# Patient Record
Sex: Female | Born: 1995 | Race: White | Hispanic: No | Marital: Married | Smoking: Never smoker
Health system: Southern US, Community
[De-identification: ages and names within clinical notes are randomized; demographics above are authoritative.]

## PROBLEM LIST (undated history)

## (undated) DIAGNOSIS — N912 Amenorrhea, unspecified: Secondary | ICD-10-CM

## (undated) DIAGNOSIS — G4730 Sleep apnea, unspecified: Secondary | ICD-10-CM

## (undated) DIAGNOSIS — F32A Depression, unspecified: Secondary | ICD-10-CM

## (undated) DIAGNOSIS — F419 Anxiety disorder, unspecified: Secondary | ICD-10-CM

## (undated) DIAGNOSIS — Z8742 Personal history of other diseases of the female genital tract: Secondary | ICD-10-CM

## (undated) DIAGNOSIS — K810 Acute cholecystitis: Secondary | ICD-10-CM

## (undated) HISTORY — PX: WISDOM TOOTH EXTRACTION: SHX21

## (undated) HISTORY — DX: Sleep apnea, unspecified: G47.30

## (undated) HISTORY — DX: Acute cholecystitis: K81.0

## (undated) HISTORY — PX: MANDIBLE FRACTURE SURGERY: SHX706

## (undated) HISTORY — DX: Anxiety disorder, unspecified: F41.9

## (undated) HISTORY — PX: TONSILLECTOMY: SUR1361

## (undated) HISTORY — DX: Amenorrhea, unspecified: N91.2

## (undated) HISTORY — DX: Depression, unspecified: F32.A

## (undated) HISTORY — DX: Personal history of other diseases of the female genital tract: Z87.42

## (undated) HISTORY — PX: GALLBLADDER SURGERY: SHX652

---
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
## 2015-12-07 ENCOUNTER — Encounter: Payer: Self-pay | Admitting: Obstetrics and Gynecology

## 2015-12-07 ENCOUNTER — Ambulatory Visit (INDEPENDENT_AMBULATORY_CARE_PROVIDER_SITE_OTHER): Payer: Managed Care, Other (non HMO) | Admitting: Obstetrics and Gynecology

## 2015-12-07 VITALS — BP 131/80 | HR 94 | Ht 67.0 in | Wt 188.9 lb

## 2015-12-07 DIAGNOSIS — Z8742 Personal history of other diseases of the female genital tract: Secondary | ICD-10-CM

## 2015-12-07 HISTORY — DX: Personal history of other diseases of the female genital tract: Z87.42

## 2015-12-07 NOTE — Progress Notes (Signed)
    GYNECOLOGY PROGRESS NOTE  Subjective:    Patient ID: Kari Conner, female    DOB: 06/04/1995, 20 y.o.   MRN: [id number]  HPI  Patient is a 20 y.o. G0P0000 female who presents for follow up of ruptured ovarian cyst.  [facility]2015 due to sudden onset of abdominal pain.  Was diagnosed with suspected ruptured right ovarian cyst based on ultrasound and CT findings.  Patient denies h/o ovarian cysts.  Currently asymptomatic today.    History reviewed. No pertinent past medical history.  Family History  Problem Relation Age of Onset  . Hyperlipidemia Mother   . Hyperlipidemia Father   . Cancer Maternal Grandmother     Past Surgical History:  Procedure Laterality Date  . TONSILLECTOMY    . WISDOM TOOTH EXTRACTION      Social History   Social History  . Marital status: Single    Spouse name: N/A  . Number of children: N/A  . Years of education: N/A   Occupational History  . Not on file.   Social History Main Topics  . Smoking status: Never Smoker  . Smokeless tobacco: Former NeurosurgeonUser  . Alcohol use Yes     Comment: social  . Drug use: No  . Sexual activity: Yes    Birth control/ protection: None   Other Topics Concern  . Not on file   Social History Narrative  . No narrative on file    No current outpatient prescriptions on file prior to visit.   No current facility-administered medications on file prior to visit.     No Known Allergies   Review of Systems A comprehensive review of systems was negative.   Objective:   Blood pressure 131/80, pulse 94, height 5\' 7"  (1.702 m), weight 188 lb 14.4 oz (85.7 kg), last menstrual period 11/23/2015. General appearance: alert and no distress Abdomen: soft, non-tender; bowel sounds normal; no masses,  no organomegaly Pelvic: deferred Extremities: extremities normal, atraumatic, no cyanosis or edema Neurologic: Grossly normal   Assessment:   H/o ruptured ovarian  cyst  Plan:   - Reviewed ER notes, labs, and imaging. Discussion had on diagnosis and potential management options.  Discussed that patient may be at risk for development of cysts in the future.  Recommended hormonal suppression with OCPs for contraception as well as for prevention of future cysts. Patient notes that she had her boyfriend have been discussion the possibility of conceiving soon.  Has been off OCPs (Lo-Loestrin) since January as she was non-compliant (forgetting pills) and decided that she did not want hormonal manipulation of her cycles. Declines resumption of OCPs at this time. Advised that there is no further f/u needed at this time.   - Patient desires to have annual exam as she notes it has been over 1 year since her last one.  Will schedule to return in 1 month.     A total of 30 minutes were spent face-to-face with the patient during this encounter and over half of that time involved counseling and review of records.  Hildred LaserAnika Mirca Yale, MD [facility]

## 2016-01-26 ENCOUNTER — Encounter: Payer: Self-pay | Admitting: Obstetrics and Gynecology

## 2016-01-26 ENCOUNTER — Ambulatory Visit (INDEPENDENT_AMBULATORY_CARE_PROVIDER_SITE_OTHER): Payer: Managed Care, Other (non HMO) | Admitting: Obstetrics and Gynecology

## 2016-01-26 DIAGNOSIS — Z124 Encounter for screening for malignant neoplasm of cervix: Secondary | ICD-10-CM

## 2016-01-26 DIAGNOSIS — L680 Hirsutism: Secondary | ICD-10-CM | POA: Diagnosis not present

## 2016-01-26 DIAGNOSIS — Z01419 Encounter for gynecological examination (general) (routine) without abnormal findings: Secondary | ICD-10-CM

## 2016-01-26 LAB — HM PAP SMEAR: HM Pap smear: NEGATIVE

## 2016-01-26 NOTE — Progress Notes (Signed)
GYNECOLOGY ANNUAL PHYSICAL EXAM PROGRESS NOTE  Subjective:    Kari Conner is a 20 y.o. G0P0000 female who presents for an annual exam.  The patient is sexually active (same partner x 4 years). The patient wears seatbelts: yes. The patient participates in regular exercise: no. Has the patient ever been transfused or tattooed?: no. The patient reports that there is not domestic violence in her life.    The patient has the following complaints today: 1. Notes increased hair growth around nipples.  Has been ongoing for the past 3-4 weeks. Has always had slight facial hair (scarce mustache)  Gynecologic History Menarche age: [age] Patient's last menstrual period was 01/23/2016 (exact date). Contraception: none History of STI's: Denies  Last Pap: No prior pap history   Obstetric History   G0   P0   T0   P0   A0   L0    SAB0   TAB0   Ectopic0   Multiple0   Live Births0       History reviewed. No pertinent past medical history.  Past Surgical History:  Procedure Laterality Date  . TONSILLECTOMY    . WISDOM TOOTH EXTRACTION      Family History  Problem Relation Age of Onset  . Hyperlipidemia Mother   . Hyperlipidemia Father   . Cancer (Lung) Maternal Grandmother     Social History   Social History  . Marital status: Single    Spouse name: N/A  . Number of children: N/A  . Years of education: N/A   Occupational History  . Not on file.   Social History Main Topics  . Smoking status: Never Smoker  . Smokeless tobacco: Former NeurosurgeonUser  . Alcohol use Yes     Comment: social  . Drug use: No  . Sexual activity: Yes    Birth control/ protection: None   Other Topics Concern  . Not on file   Social History Narrative  . No narrative on file    No current outpatient prescriptions on file prior to visit.   No current facility-administered medications on file prior to visit.     No Known Allergies   Review of Systems Constitutional: negative for chills,  fatigue, fevers and sweats Eyes: negative for irritation, redness and visual disturbance Ears, nose, mouth, throat, and face: negative for hearing loss, nasal congestion, snoring and tinnitus Respiratory: negative for asthma, cough, sputum Cardiovascular: negative for chest pain, dyspnea, exertional chest pressure/discomfort, irregular heart beat, palpitations and syncope Gastrointestinal: negative for abdominal pain, change in bowel habits, nausea and vomiting Genitourinary: negative for abnormal menstrual periods, genital lesions, sexual problems and vaginal discharge, dysuria and urinary incontinence Integument/breast: negative for breast lump, breast tenderness and nipple discharge Hematologic/lymphatic: negative for bleeding and easy bruising Musculoskeletal:negative for back pain and muscle weakness Neurological: negative for dizziness, headaches, vertigo and weakness Endocrine: negative for diabetic symptoms including polydipsia, polyuria and skin dryness Allergic/Immunologic: negative for hay fever and urticaria      Objective:  Blood pressure 114/76, pulse 83, height 5\' 7"  (1.702 m), weight 188 lb 14.4 oz (85.7 kg), last menstrual period 01/23/2016. Body mass index is 29.59 kg/m.   General Appearance:    Alert, cooperative, no distress, appears stated age, overweight   Head:    Normocephalic, without obvious abnormality, atraumatic  Eyes:    PERRL, conjunctiva/corneas clear, EOM's intact, both eyes  Ears:    Normal external ear canals, both ears  Nose:   Nares normal, septum midline, mucosa  normal, no drainage or sinus tenderness  Throat:   Lips, mucosa, and tongue normal; teeth and gums normal  Neck:   Supple, symmetrical, trachea midline, no adenopathy; thyroid: no enlargement/tenderness/nodules; no carotid bruit or JVD  Back:     Symmetric, no curvature, ROM normal, no CVA tenderness  Lungs:     Clear to auscultation bilaterally, respirations unlabored  Chest Wall:    No  tenderness or deformity   Heart:    Regular rate and rhythm, S1 and S2 normal, no murmur, rub or gallop  Breast Exam:    No tenderness, masses, inverted nipples bilaterally.  Sparse long dark hair around left nipple, slightly more on right nipple.   Abdomen:     Soft, non-tender, bowel sounds active all four quadrants, no masses, no organomegaly.    Genitalia:    Pelvic:external genitalia normal, vagina without lesions, discharge, or tenderness, rectovaginal septum  normal. Cervix normal in appearance, no cervical motion tenderness, no adnexal masses or tenderness.  Uterus normal size, shape, mobile, regular contours, nontender.  Rectal:    Normal external sphincter.  No hemorrhoids appreciated. Internal exam not done.   Extremities:   Extremities normal, atraumatic, no cyanosis or edema  Pulses:   2+ and symmetric all extremities  Skin:   Skin color, texture, turgor normal, no rashes or lesions.  Scant thin hair along upper lip.    Lymph nodes:   Cervical, supraclavicular, and axillary nodes normal  Neurologic:   CNII-XII intact, normal strength, sensation and reflexes throughout   .  Labs:  No results found for: WBC, HGB, HCT, MCV, PLT  No results found for: CREATININE, BUN, NA, K, CL, CO2   Assessment:    Healthy female exam.   Cervical cancer screening  Mild hirsutism Plan:    Blood tests: Basic metabolic panel, CBC with diff and Testosterone and DHEAS levels. Breast self exam technique reviewed and patient encouraged to perform self-exam monthly. Contraception: none.  Is considering conceiving soon.  Discussed healthy lifestyle modifications. Pap smear performed today.   Discussed management options for mild hirsutism, including hair removal creams, plucking, waxing.  If hirsutism worsens, can discuss medications.  Declines flu vaccine.  Discussed Gardasil vaccine, declines.    Hildred Laser, MD [facility]

## 2016-01-26 NOTE — Patient Instructions (Signed)
Preventive Care for Adults, Female A healthy lifestyle and preventive care can promote health and wellness. Preventive health guidelines for women include the following key practices.  A routine yearly physical is a good way to check with your health care provider about your health and preventive screening. It is a chance to share any concerns and updates on your health and to receive a thorough exam.  Visit your dentist for a routine exam and preventive care every 6 months. Brush your teeth twice a day and floss once a day. Good oral hygiene prevents tooth decay and gum disease.  The frequency of eye exams is based on your age, health, family medical history, use of contact lenses, and other factors. Follow your health care provider's recommendations for frequency of eye exams.  Eat a healthy diet. Foods like vegetables, fruits, whole grains, low-fat dairy products, and lean protein foods contain the nutrients you need without too many calories. Decrease your intake of foods high in solid fats, added sugars, and salt. Eat the right amount of calories for you.Get information about a proper diet from your health care provider, if necessary.  Regular physical exercise is one of the most important things you can do for your health. Most adults should get at least 150 minutes of moderate-intensity exercise (any activity that increases your heart rate and causes you to sweat) each week. In addition, most adults need muscle-strengthening exercises on 2 or more days a week.  Maintain a healthy weight. The body mass index (BMI) is a screening tool to identify possible weight problems. It provides an estimate of body fat based on height and weight. Your health care provider can find your BMI and can help you achieve or maintain a healthy weight.For adults 20 years and older:  A BMI below 18.5 is considered underweight.  A BMI of 18.5 to 24.9 is normal.  A BMI of 25 to 29.9 is considered  overweight.  A BMI of 30 and above is considered obese.  Maintain normal blood lipids and cholesterol levels by exercising and minimizing your intake of saturated fat. Eat a balanced diet with plenty of fruit and vegetables. Blood tests for lipids and cholesterol should begin at age 64 and be repeated every 5 years. If your lipid or cholesterol levels are high, you are over 50, or you are at high risk for heart disease, you may need your cholesterol levels checked more frequently.Ongoing high lipid and cholesterol levels should be treated with medicines if diet and exercise are not working.  If you smoke, find out from your health care provider how to quit. If you do not use tobacco, do not start.  Lung cancer screening is recommended for adults aged 52-80 years who are at high risk for developing lung cancer because of a history of smoking. A yearly low-dose CT scan of the lungs is recommended for people who have at least a 30-pack-year history of smoking and are a current smoker or have quit within the past 15 years. A pack year of smoking is smoking an average of 1 pack of cigarettes a day for 1 year (for example: 1 pack a day for 30 years or 2 packs a day for 15 years). Yearly screening should continue until the smoker has stopped smoking for at least 15 years. Yearly screening should be stopped for people who develop a health problem that would prevent them from having lung cancer treatment.  If you are pregnant, do not drink alcohol. If you are  breastfeeding, be very cautious about drinking alcohol. If you are not pregnant and choose to drink alcohol, do not have more than 1 drink per day. One drink is considered to be 12 ounces (355 mL) of beer, 5 ounces (148 mL) of wine, or 1.5 ounces (44 mL) of liquor.  Avoid use of street drugs. Do not share needles with anyone. Ask for help if you need support or instructions about stopping the use of drugs.  High blood pressure causes heart disease and  increases the risk of stroke. Your blood pressure should be checked at least every 1 to 2 years. Ongoing high blood pressure should be treated with medicines if weight loss and exercise do not work.  If you are 25-78 years old, ask your health care provider if you should take aspirin to prevent strokes.  Diabetes screening is done by taking a blood sample to check your blood glucose level after you have not eaten for a certain period of time (fasting). If you are not overweight and you do not have risk factors for diabetes, you should be screened once every 3 years starting at age 86. If you are overweight or obese and you are 3-87 years of age, you should be screened for diabetes every year as part of your cardiovascular risk assessment.  Breast cancer screening is essential preventive care for women. You should practice "breast self-awareness." This means understanding the normal appearance and feel of your breasts and may include breast self-examination. Any changes detected, no matter how small, should be reported to a health care provider. Women in their 66s and 30s should have a clinical breast exam (CBE) by a health care provider as part of a regular health exam every 1 to 3 years. After age 43, women should have a CBE every year. Starting at age 37, women should consider having a mammogram (breast X-ray test) every year. Women who have a family history of breast cancer should talk to their health care provider about genetic screening. Women at a high risk of breast cancer should talk to their health care providers about having an MRI and a mammogram every year.  Breast cancer gene (BRCA)-related cancer risk assessment is recommended for women who have family members with BRCA-related cancers. BRCA-related cancers include breast, ovarian, tubal, and peritoneal cancers. Having family members with these cancers may be associated with an increased risk for harmful changes (mutations) in the breast  cancer genes BRCA1 and BRCA2. Results of the assessment will determine the need for genetic counseling and BRCA1 and BRCA2 testing.  Your health care provider may recommend that you be screened regularly for cancer of the pelvic organs (ovaries, uterus, and vagina). This screening involves a pelvic examination, including checking for microscopic changes to the surface of your cervix (Pap test). You may be encouraged to have this screening done every 3 years, beginning at age 78.  For women ages 79-65, health care providers may recommend pelvic exams and Pap testing every 3 years, or they may recommend the Pap and pelvic exam, combined with testing for human papilloma virus (HPV), every 5 years. Some types of HPV increase your risk of cervical cancer. Testing for HPV may also be done on women of any age with unclear Pap test results.  Other health care providers may not recommend any screening for nonpregnant women who are considered low risk for pelvic cancer and who do not have symptoms. Ask your health care provider if a screening pelvic exam is right for  you.  If you have had past treatment for cervical cancer or a condition that could lead to cancer, you need Pap tests and screening for cancer for at least 20 years after your treatment. If Pap tests have been discontinued, your risk factors (such as having a new sexual partner) need to be reassessed to determine if screening should resume. Some women have medical problems that increase the chance of getting cervical cancer. In these cases, your health care provider may recommend more frequent screening and Pap tests.  Colorectal cancer can be detected and often prevented. Most routine colorectal cancer screening begins at the age of 50 years and continues through age 75 years. However, your health care provider may recommend screening at an earlier age if you have risk factors for colon cancer. On a yearly basis, your health care provider may provide  home test kits to check for hidden blood in the stool. Use of a small camera at the end of a tube, to directly examine the colon (sigmoidoscopy or colonoscopy), can detect the earliest forms of colorectal cancer. Talk to your health care provider about this at age 50, when routine screening begins. Direct exam of the colon should be repeated every 5-10 years through age 75 years, unless early forms of precancerous polyps or small growths are found.  People who are at an increased risk for hepatitis B should be screened for this virus. You are considered at high risk for hepatitis B if:  You were born in a country where hepatitis B occurs often. Talk with your health care provider about which countries are considered high risk.  Your parents were born in a high-risk country and you have not received a shot to protect against hepatitis B (hepatitis B vaccine).  You have HIV or AIDS.  You use needles to inject street drugs.  You live with, or have sex with, someone who has hepatitis B.  You get hemodialysis treatment.  You take certain medicines for conditions like cancer, organ transplantation, and autoimmune conditions.  Hepatitis C blood testing is recommended for all people born from 1945 through 1965 and any individual with known risks for hepatitis C.  Practice safe sex. Use condoms and avoid high-risk sexual practices to reduce the spread of sexually transmitted infections (STIs). STIs include gonorrhea, chlamydia, syphilis, trichomonas, herpes, HPV, and human immunodeficiency virus (HIV). Herpes, HIV, and HPV are viral illnesses that have no cure. They can result in disability, cancer, and death.  You should be screened for sexually transmitted illnesses (STIs) including gonorrhea and chlamydia if:  You are sexually active and are younger than 24 years.  You are older than 24 years and your health care provider tells you that you are at risk for this type of infection.  Your sexual  activity has changed since you were last screened and you are at an increased risk for chlamydia or gonorrhea. Ask your health care provider if you are at risk.  If you are at risk of being infected with HIV, it is recommended that you take a prescription medicine daily to prevent HIV infection. This is called preexposure prophylaxis (PrEP). You are considered at risk if:  You are sexually active and do not regularly use condoms or know the HIV status of your partner(s).  You take drugs by injection.  You are sexually active with a partner who has HIV.  Talk with your health care provider about whether you are at high risk of being infected with HIV. If   you choose to begin PrEP, you should first be tested for HIV. You should then be tested every 3 months for as long as you are taking PrEP.  Osteoporosis is a disease in which the bones lose minerals and strength with aging. This can result in serious bone fractures or breaks. The risk of osteoporosis can be identified using a bone density scan. Women ages 1 years and over and women at risk for fractures or osteoporosis should discuss screening with their health care providers. Ask your health care provider whether you should take a calcium supplement or vitamin D to reduce the rate of osteoporosis.  Menopause can be associated with physical symptoms and risks. Hormone replacement therapy is available to decrease symptoms and risks. You should talk to your health care provider about whether hormone replacement therapy is right for you.  Use sunscreen. Apply sunscreen liberally and repeatedly throughout the day. You should seek shade when your shadow is shorter than you. Protect yourself by wearing long sleeves, pants, a wide-brimmed hat, and sunglasses year round, whenever you are outdoors.  Once a month, do a whole body skin exam, using a mirror to look at the skin on your back. Tell your health care provider of new moles, moles that have irregular  borders, moles that are larger than a pencil eraser, or moles that have changed in shape or color.  Stay current with required vaccines (immunizations).  Influenza vaccine. All adults should be immunized every year.  Tetanus, diphtheria, and acellular pertussis (Td, Tdap) vaccine. Pregnant women should receive 1 dose of Tdap vaccine during each pregnancy. The dose should be obtained regardless of the length of time since the last dose. Immunization is preferred during the 27th-36th week of gestation. An adult who has not previously received Tdap or who does not know her vaccine status should receive 1 dose of Tdap. This initial dose should be followed by tetanus and diphtheria toxoids (Td) booster doses every 10 years. Adults with an unknown or incomplete history of completing a 3-dose immunization series with Td-containing vaccines should begin or complete a primary immunization series including a Tdap dose. Adults should receive a Td booster every 10 years.  Varicella vaccine. An adult without evidence of immunity to varicella should receive 2 doses or a second dose if she has previously received 1 dose. Pregnant females who do not have evidence of immunity should receive the first dose after pregnancy. This first dose should be obtained before leaving the health care facility. The second dose should be obtained 4-8 weeks after the first dose.  Human papillomavirus (HPV) vaccine. Females aged 13-26 years who have not received the vaccine previously should obtain the 3-dose series. The vaccine is not recommended for use in pregnant females. However, pregnancy testing is not needed before receiving a dose. If a female is found to be pregnant after receiving a dose, no treatment is needed. In that case, the remaining doses should be delayed until after the pregnancy. Immunization is recommended for any person with an immunocompromised condition through the age of 24 years if she did not get any or all doses  earlier. During the 3-dose series, the second dose should be obtained 4-8 weeks after the first dose. The third dose should be obtained 24 weeks after the first dose and 16 weeks after the second dose.  Zoster vaccine. One dose is recommended for adults aged 97 years or older unless certain conditions are present.  Measles, mumps, and rubella (MMR) vaccine. Adults born  before 1957 generally are considered immune to measles and mumps. Adults born in [birth year] or later should have 1 or more doses of MMR vaccine unless there is a contraindication to the vaccine or there is laboratory evidence of immunity to each of the three diseases. A routine second dose of MMR vaccine should be obtained at least 28 days after the first dose for students attending postsecondary schools, health care workers, or international travelers. People who received inactivated measles vaccine or an unknown type of measles vaccine during 1963-1967 should receive 2 doses of MMR vaccine. People who received inactivated mumps vaccine or an unknown type of mumps vaccine before 1979 and are at high risk for mumps infection should consider immunization with 2 doses of MMR vaccine. For females of childbearing age, rubella immunity should be determined. If there is no evidence of immunity, females who are not pregnant should be vaccinated. If there is no evidence of immunity, females who are pregnant should delay immunization until after pregnancy. Unvaccinated health care workers born before 60 who lack laboratory evidence of measles, mumps, or rubella immunity or laboratory confirmation of disease should consider measles and mumps immunization with 2 doses of MMR vaccine or rubella immunization with 1 dose of MMR vaccine.  Pneumococcal 13-valent conjugate (PCV13) vaccine. When indicated, a person who is uncertain of his immunization history and has no record of immunization should receive the PCV13 vaccine. All adults 61 years of age and older  should receive this vaccine. An adult aged 92 years or older who has certain medical conditions and has not been previously immunized should receive 1 dose of PCV13 vaccine. This PCV13 should be followed with a dose of pneumococcal polysaccharide (PPSV23) vaccine. Adults who are at high risk for pneumococcal disease should obtain the PPSV23 vaccine at least 8 weeks after the dose of PCV13 vaccine. Adults older than 21 years of age who have normal immune system function should obtain the PPSV23 vaccine dose at least 1 year after the dose of PCV13 vaccine.  Pneumococcal polysaccharide (PPSV23) vaccine. When PCV13 is also indicated, PCV13 should be obtained first. All adults aged 2 years and older should be immunized. An adult younger than age 30 years who has certain medical conditions should be immunized. Any person who resides in a nursing home or long-term care facility should be immunized. An adult smoker should be immunized. People with an immunocompromised condition and certain other conditions should receive both PCV13 and PPSV23 vaccines. People with human immunodeficiency virus (HIV) infection should be immunized as soon as possible after diagnosis. Immunization during chemotherapy or radiation therapy should be avoided. Routine use of PPSV23 vaccine is not recommended for American Indians, [place] Natives, or people younger than 65 years unless there are medical conditions that require PPSV23 vaccine. When indicated, people who have unknown immunization and have no record of immunization should receive PPSV23 vaccine. One-time revaccination 5 years after the first dose of PPSV23 is recommended for people aged 19-64 years who have chronic kidney failure, nephrotic syndrome, asplenia, or immunocompromised conditions. People who received 1-2 doses of PPSV23 before age 44 years should receive another dose of PPSV23 vaccine at age 83 years or later if at least 5 years have passed since the previous dose. Doses  of PPSV23 are not needed for people immunized with PPSV23 at or after age 20 years.  Meningococcal vaccine. Adults with asplenia or persistent complement component deficiencies should receive 2 doses of quadrivalent meningococcal conjugate (MenACWY-D) vaccine. The doses should be obtained  at least 2 months apart. Microbiologists working with certain meningococcal bacteria, [place] recruits, people at risk during an outbreak, and people who travel to or live in countries with a high rate of meningitis should be immunized. A first-year college student up through age 28 years who is living in a residence hall should receive a dose if she did not receive a dose on or after her 16th birthday. Adults who have certain high-risk conditions should receive one or more doses of vaccine.  Hepatitis A vaccine. Adults who wish to be protected from this disease, have certain high-risk conditions, work with hepatitis A-infected animals, work in hepatitis A research labs, or travel to or work in countries with a high rate of hepatitis A should be immunized. Adults who were previously unvaccinated and who anticipate close contact with an international adoptee during the first 60 days after arrival in the [place] from a country with a high rate of hepatitis A should be immunized.  Hepatitis B vaccine. Adults who wish to be protected from this disease, have certain high-risk conditions, may be exposed to blood or other infectious body fluids, are household contacts or sex partners of hepatitis B positive people, are clients or workers in certain care facilities, or travel to or work in countries with a high rate of hepatitis B should be immunized.  Haemophilus influenzae type b (Hib) vaccine. A previously unvaccinated person with asplenia or sickle cell disease or having a scheduled splenectomy should receive 1 dose of Hib vaccine. Regardless of previous immunization, a recipient of a hematopoietic stem cell transplant  should receive a 3-dose series 6-12 months after her successful transplant. Hib vaccine is not recommended for adults with HIV infection. Preventive Services / Frequency Ages 71 to 87 years  Blood pressure check.** / Every 3-5 years.  Lipid and cholesterol check.** / Every 5 years beginning at age 1.  Clinical breast exam.** / Every 3 years for women in their 3s and 31s.  BRCA-related cancer risk assessment.** / For women who have family members with a BRCA-related cancer (breast, ovarian, tubal, or peritoneal cancers).  Pap test.** / Every 2 years from ages 50 through 86. Every 3 years starting at age 87 through age 7 or 75 with a history of 3 consecutive normal Pap tests.  HPV screening.** / Every 3 years from ages 59 through ages 35 to 6 with a history of 3 consecutive normal Pap tests.  Hepatitis C blood test.** / For any individual with known risks for hepatitis C.  Skin self-exam. / Monthly.  Influenza vaccine. / Every year.  Tetanus, diphtheria, and acellular pertussis (Tdap, Td) vaccine.** / Consult your health care provider. Pregnant women should receive 1 dose of Tdap vaccine during each pregnancy. 1 dose of Td every 10 years.  Varicella vaccine.** / Consult your health care provider. Pregnant females who do not have evidence of immunity should receive the first dose after pregnancy.  HPV vaccine. / 3 doses over 6 months, if 72 and younger. The vaccine is not recommended for use in pregnant females. However, pregnancy testing is not needed before receiving a dose.  Measles, mumps, rubella (MMR) vaccine.** / You need at least 1 dose of MMR if you were born in 1957 or later. You may also need a 2nd dose. For females of childbearing age, rubella immunity should be determined. If there is no evidence of immunity, females who are not pregnant should be vaccinated. If there is no evidence of immunity, females who are  pregnant should delay immunization until after  pregnancy.  Pneumococcal 13-valent conjugate (PCV13) vaccine.** / Consult your health care provider.  Pneumococcal polysaccharide (PPSV23) vaccine.** / 1 to 2 doses if you smoke cigarettes or if you have certain conditions.  Meningococcal vaccine.** / 1 dose if you are age 87 to 44 years and a Market researcher living in a residence hall, or have one of several medical conditions, you need to get vaccinated against meningococcal disease. You may also need additional booster doses.  Hepatitis A vaccine.** / Consult your health care provider.  Hepatitis B vaccine.** / Consult your health care provider.  Haemophilus influenzae type b (Hib) vaccine.** / Consult your health care provider. Ages 86 to 38 years  Blood pressure check.** / Every year.  Lipid and cholesterol check.** / Every 5 years beginning at age 49 years.  Lung cancer screening. / Every year if you are aged 71-80 years and have a 30-pack-year history of smoking and currently smoke or have quit within the past 15 years. Yearly screening is stopped once you have quit smoking for at least 15 years or develop a health problem that would prevent you from having lung cancer treatment.  Clinical breast exam.** / Every year after age 51 years.  BRCA-related cancer risk assessment.** / For women who have family members with a BRCA-related cancer (breast, ovarian, tubal, or peritoneal cancers).  Mammogram.** / Every year beginning at age 18 years and continuing for as long as you are in good health. Consult with your health care provider.  Pap test.** / Every 3 years starting at age 63 years through age 37 or 57 years with a history of 3 consecutive normal Pap tests.  HPV screening.** / Every 3 years from ages 41 years through ages 76 to 23 years with a history of 3 consecutive normal Pap tests.  Fecal occult blood test (FOBT) of stool. / Every year beginning at age 36 years and continuing until age 51 years. You may not need  to do this test if you get a colonoscopy every 10 years.  Flexible sigmoidoscopy or colonoscopy.** / Every 5 years for a flexible sigmoidoscopy or every 10 years for a colonoscopy beginning at age 36 years and continuing until age 35 years.  Hepatitis C blood test.** / For all people born from [birth year] through 1965 and any individual with known risks for hepatitis C.  Skin self-exam. / Monthly.  Influenza vaccine. / Every year.  Tetanus, diphtheria, and acellular pertussis (Tdap/Td) vaccine.** / Consult your health care provider. Pregnant women should receive 1 dose of Tdap vaccine during each pregnancy. 1 dose of Td every 10 years.  Varicella vaccine.** / Consult your health care provider. Pregnant females who do not have evidence of immunity should receive the first dose after pregnancy.  Zoster vaccine.** / 1 dose for adults aged 73 years or older.  Measles, mumps, rubella (MMR) vaccine.** / You need at least 1 dose of MMR if you were born in 1959 or later. You may also need a second dose. For females of childbearing age, rubella immunity should be determined. If there is no evidence of immunity, females who are not pregnant should be vaccinated. If there is no evidence of immunity, females who are pregnant should delay immunization until after pregnancy.  Pneumococcal 13-valent conjugate (PCV13) vaccine.** / Consult your health care provider.  Pneumococcal polysaccharide (PPSV23) vaccine.** / 1 to 2 doses if you smoke cigarettes or if you have certain conditions.  Meningococcal vaccine.** /  Consult your health care provider.  Hepatitis A vaccine.** / Consult your health care provider.  Hepatitis B vaccine.** / Consult your health care provider.  Haemophilus influenzae type b (Hib) vaccine.** / Consult your health care provider. Ages 80 years and over  Blood pressure check.** / Every year.  Lipid and cholesterol check.** / Every 5 years beginning at age 62 years.  Lung cancer  screening. / Every year if you are aged 32-80 years and have a 30-pack-year history of smoking and currently smoke or have quit within the past 15 years. Yearly screening is stopped once you have quit smoking for at least 15 years or develop a health problem that would prevent you from having lung cancer treatment.  Clinical breast exam.** / Every year after age 61 years.  BRCA-related cancer risk assessment.** / For women who have family members with a BRCA-related cancer (breast, ovarian, tubal, or peritoneal cancers).  Mammogram.** / Every year beginning at age 39 years and continuing for as long as you are in good health. Consult with your health care provider.  Pap test.** / Every 3 years starting at age 85 years through age 74 or 72 years with 3 consecutive normal Pap tests. Testing can be stopped between 65 and 70 years with 3 consecutive normal Pap tests and no abnormal Pap or HPV tests in the past 10 years.  HPV screening.** / Every 3 years from ages 55 years through ages 67 or 77 years with a history of 3 consecutive normal Pap tests. Testing can be stopped between 65 and 70 years with 3 consecutive normal Pap tests and no abnormal Pap or HPV tests in the past 10 years.  Fecal occult blood test (FOBT) of stool. / Every year beginning at age 81 years and continuing until age 22 years. You may not need to do this test if you get a colonoscopy every 10 years.  Flexible sigmoidoscopy or colonoscopy.** / Every 5 years for a flexible sigmoidoscopy or every 10 years for a colonoscopy beginning at age 67 years and continuing until age 22 years.  Hepatitis C blood test.** / For all people born from [birth year] through 1967 and any individual with known risks for hepatitis C.  Osteoporosis screening.** / A one-time screening for women ages 8 years and over and women at risk for fractures or osteoporosis.  Skin self-exam. / Monthly.  Influenza vaccine. / Every year.  Tetanus, diphtheria, and  acellular pertussis (Tdap/Td) vaccine.** / 1 dose of Td every 10 years.  Varicella vaccine.** / Consult your health care provider.  Zoster vaccine.** / 1 dose for adults aged 56 years or older.  Pneumococcal 13-valent conjugate (PCV13) vaccine.** / Consult your health care provider.  Pneumococcal polysaccharide (PPSV23) vaccine.** / 1 dose for all adults aged 15 years and older.  Meningococcal vaccine.** / Consult your health care provider.  Hepatitis A vaccine.** / Consult your health care provider.  Hepatitis B vaccine.** / Consult your health care provider.  Haemophilus influenzae type b (Hib) vaccine.** / Consult your health care provider. ** Family history and personal history of risk and conditions may change your health care provider's recommendations.   This information is not intended to replace advice given to you by your health care provider. Make sure you discuss any questions you have with your health care provider.   Document Released: 06/06/2001 Document Revised: 05/01/2014 Document Reviewed: 09/05/2010 [employer] Interactive Patient Education [employer].

## 2016-01-28 LAB — CBC
HEMATOCRIT: 38.6 % (ref 34.0–46.6)
HEMOGLOBIN: 13 g/dL (ref 11.1–15.9)
MCH: 29.5 pg (ref 26.6–33.0)
MCHC: 33.7 g/dL (ref 31.5–35.7)
MCV: 88 fL (ref 79–97)
Platelets: 258 10*3/uL (ref 150–379)
RBC: 4.41 x10E6/uL (ref 3.77–5.28)
RDW: 12.6 % (ref 12.3–15.4)
WBC: 7.4 10*3/uL (ref 3.4–10.8)

## 2016-01-28 LAB — DHEA-SULFATE: DHEA SO4: 285.1 ug/dL (ref 110.0–431.7)

## 2016-01-28 LAB — TESTOSTERONE, FREE, TOTAL, SHBG
SEX HORMONE BINDING: 41 nmol/L (ref 24.6–122.0)
Testosterone, Free: 0.9 pg/mL (ref 0.0–4.2)
Testosterone: 31 ng/dL (ref 8–48)

## 2016-01-28 LAB — BASIC METABOLIC PANEL
BUN/Creatinine Ratio: 17 (ref 9–23)
BUN: 12 mg/dL (ref 6–20)
CALCIUM: 9.2 mg/dL (ref 8.7–10.2)
CO2: 23 mmol/L (ref 18–29)
CREATININE: 0.7 mg/dL (ref 0.57–1.00)
Chloride: 104 mmol/L (ref 96–106)
GFR, EST AFRICAN AMERICAN: 143 mL/min/{1.73_m2} (ref >59)
GFR, EST NON AFRICAN AMERICAN: 124 mL/min/{1.73_m2} (ref >59)
Glucose: 81 mg/dL (ref 65–99)
POTASSIUM: 4.4 mmol/L (ref 3.5–5.2)
Sodium: 141 mmol/L (ref 134–144)

## 2016-01-29 LAB — PAP IG, CT-NG, RFX HPV ASCU
CHLAMYDIA, NUC. ACID AMP: NEGATIVE
GONOCOCCUS BY NUCLEIC ACID AMP: NEGATIVE
PAP Smear Comment: 0

## 2016-05-23 DIAGNOSIS — R197 Diarrhea, unspecified: Secondary | ICD-10-CM | POA: Insufficient documentation

## 2016-05-30 ENCOUNTER — Encounter: Payer: Managed Care, Other (non HMO) | Admitting: Obstetrics and Gynecology

## 2016-06-01 ENCOUNTER — Encounter: Payer: Managed Care, Other (non HMO) | Admitting: Obstetrics and Gynecology

## 2016-06-05 ENCOUNTER — Ambulatory Visit (INDEPENDENT_AMBULATORY_CARE_PROVIDER_SITE_OTHER): Payer: Commercial Managed Care - PPO | Admitting: Obstetrics and Gynecology

## 2016-06-05 VITALS — BP 126/82 | HR 88 | Ht 66.5 in | Wt 194.9 lb

## 2016-06-05 DIAGNOSIS — Z1389 Encounter for screening for other disorder: Secondary | ICD-10-CM

## 2016-06-05 DIAGNOSIS — Z3687 Encounter for antenatal screening for uncertain dates: Secondary | ICD-10-CM

## 2016-06-05 DIAGNOSIS — E669 Obesity, unspecified: Secondary | ICD-10-CM

## 2016-06-05 DIAGNOSIS — Z113 Encounter for screening for infections with a predominantly sexual mode of transmission: Secondary | ICD-10-CM

## 2016-06-05 DIAGNOSIS — Z3400 Encounter for supervision of normal first pregnancy, unspecified trimester: Secondary | ICD-10-CM

## 2016-06-05 DIAGNOSIS — T7589XA Other specified effects of external causes, initial encounter: Secondary | ICD-10-CM

## 2016-06-05 NOTE — Patient Instructions (Signed)
Pregnancy and Zika Virus Disease Introduction Zika virus disease, or Zika, is an illness that can spread to people from mosquitoes that carry the virus. It may also spread from person to person through infected body fluids. Zika first occurred in [place], but recently it has spread to new areas. The virus occurs in tropical climates. The location of Zika continues to change. Most people who become infected with Zika virus do not develop serious illness. However, Zika may cause birth defects in an unborn baby whose mother is infected with the virus. It may also increase the risk of miscarriage. What are the symptoms of Zika virus disease? In many cases, people who have been infected with Zika virus do not develop any symptoms. If symptoms appear, they usually start about a week after the person is infected. Symptoms are usually mild. They may include:  Fever.  Rash.  Red eyes.  Joint pain. How does Zika virus disease spread? The main way that Zika virus spreads is through the bite of a certain type of mosquito. Unlike most types of mosquitos, which bite only at night, the type of mosquito that carries Zika virus bites both at night and during the day. Zika virus can also spread through sexual contact, through a blood transfusion, and from a mother to her baby before or during birth. Once you have had Zika virus disease, it is unlikely that you will get it again. Can I pass Zika to my baby during pregnancy? Yes, Zika can pass from a mother to her baby before or during birth. What problems can Zika cause for my baby? A woman who is infected with Zika virus while pregnant is at risk of having her baby born with a condition in which the brain or head is smaller than expected (microcephaly). Babies who have microcephaly can have developmental delays, seizures, hearing problems, and vision problems. Having Zika virus disease during pregnancy can also increase the risk of miscarriage. How can Zika  virus disease be prevented? There is no vaccine to prevent Zika. The best way to prevent the disease is to avoid infected mosquitoes and avoid exposure to body fluids that can spread the virus. Avoid any possible exposure to Zika by taking the following precautions. For women and their sex partners:  Avoid traveling to high-risk areas. The locations where Zika is being reported change often. To identify high-risk areas, check the CDC travel website: www.cdc.gov/zika/geo/index.html  If you or your sex partner must travel to a high-risk area, talk with a health care provider before and after traveling.  Take all precautions to avoid mosquito bites if you live in, or travel to, any of the high-risk areas. Insect repellents are safe to use during pregnancy.  Ask your health care provider when it is safe to have sexual contact. For women:  If you are pregnant or trying to become pregnant, avoid sexual contact with persons who may have been exposed to Zika virus, persons who have possible symptoms of Zika, or persons whose history you are unsure about. If you choose to have sexual contact with someone who may have been exposed to Zika virus, use condoms correctly during the entire duration of sexual activity, every time. Do not share sexual devices, as you may be exposed to body fluids.  Ask your health care provider about when it is safe to attempt pregnancy after a possible exposure to Zika virus. What steps should I take to avoid mosquito bites? Take these steps to avoid mosquito bites when you   are in a high-risk area:  Wear loose clothing that covers your arms and legs.  Limit your outdoor activities.  Do not open windows unless they have window screens.  Sleep under mosquito nets.  Use insect repellent. The best insect repellents have:  DEET, picaridin, oil of lemon eucalyptus (OLE), or IR3535 in them.  Higher amounts of an active ingredient in them.  Remember that insect repellents  are safe to use during pregnancy.  Do not use OLE on children who are younger than 3 years of age. Do not use insect repellent on babies who are younger than 2 months of age.  Cover your child's stroller with mosquito netting. Make sure the netting fits snugly and that any loose netting does not cover your child's mouth or nose. Do not use a blanket as a mosquito-protection cover.  Do not apply insect repellent underneath clothing.  If you are using sunscreen, apply the sunscreen before applying the insect repellent.  Treat clothing with permethrin. Do not apply permethrin directly to your skin. Follow label directions for safe use.  Get rid of standing water, where mosquitoes may reproduce. Standing water is often found in items such as buckets, bowls, animal food dishes, and flowerpots. When you return from traveling to any high-risk area, continue taking actions to protect yourself against mosquito bites for 3 weeks, even if you show no signs of illness. This will prevent spreading Zika virus to uninfected mosquitoes. What should I know about the sexual transmission of Zika? People can spread Zika to their sexual partners during vaginal, anal, or oral sex, or by sharing sexual devices. Many people with Zika do not develop symptoms, so a person could spread the disease without knowing that they are infected. The greatest risk is to women who are pregnant or who may become pregnant. Zika virus can live longer in semen than it can live in blood. Couples can prevent sexual transmission of the virus by:  Using condoms correctly during the entire duration of sexual activity, every time. This includes vaginal, anal, and oral sex.  Not sharing sexual devices. Sharing increases your risk of being exposed to body fluid from another person.  Avoiding all sexual activity until your health care provider says it is safe. Should I be tested for Zika virus? A sample of your blood can be tested for Zika  virus. A pregnant woman should be tested if she may have been exposed to the virus or if she has symptoms of Zika. She may also have additional tests done during her pregnancy, such ultrasound testing. Talk with your health care provider about which tests are recommended. This information is not intended to replace advice given to you by your health care provider. Make sure you discuss any questions you have with your health care provider. Document Released: 12/30/2014 Document Revised: 09/16/2015 Document Reviewed: 12/23/2014  2017 Elsevier Pregnancy and Toxoplasmosis Toxoplasmosis is an infection that is caused by a parasite. Usually, there are no symptoms and the body can fight off the infection. If you get toxoplasmosis during pregnancy, there is a chance that the infection will spread to your baby. If this happens, your baby may develop serious health problems, such as blindness, intellectual disabilities, and other neurological disorders. Some of these problems may not show up for years. How do people get toxoplasmosis? You can get toxoplasmosis if:  You touch anything that is contaminated with infected cat feces and then you touch your mouth.  You eat raw or undercooked meat from   an infected animal.  You eat fruits and vegetables that were grown in contaminated soil. Your baby can get toxoplasmosis through your blood supply if you are infected during pregnancy or just before pregnancy. How can I protect myself and my baby against toxoplasmosis?  Do not get a new cat.  Do not touch stray cats.  If you have a sandbox, cover it when it is not being used.  Avoid working in soil where cats may leave feces.  Wear gloves when you work in the soil. Wash your hands with soap and water when you are finished.  If you have a cat:  Have someone else change the cat's litter box daily. He or she should wash his or her hands afterward.  Do not let your cat outside.  Do not feed your cat any  raw meat.  Do not eat undercooked meat, especially meat that has never been frozen.  Wash and peel all fruits and vegetables before eating them.  Avoid drinking untreated water.  If you have toxoplasmosis and you are not pregnant, wait at least 6 months before becoming pregnant. How do I know if I have toxoplasmosis? The only way to know for sure that you have toxoplasmosis is with a test. People with toxoplasmosis do not always have symptoms. If symptoms are present, they may include:  A fever.  Swollen glands.  Muscle aches.  Headaches.  Feeling like you have a cold or the flu. If you think you have toxoplasmosis, or if you think that you may have been exposed to it, call your health care provider. How is toxoplasmosis diagnosed? When you become pregnant, your health care provider may order a blood test to check whether you have ever had toxoplasmosis.  If you have had toxoplasmosis infection before, you cannot get it again.  If you have never had toxoplasmosis, your health care provider may repeat this test at a later date.  If you become infected during pregnancy, your health care provider may do more tests to find out whether the infection has spread to the baby.  Other tests may include an ultrasound and a test of your amniotic fluid (amniocentesis). How is toxoplasmosis treated? Toxoplasmosis may be treated with antibiotics and other medicines.  Some of these medicines can lower your baby's chance of developing complications later on.  Medicines may need to be taken for up to one year. What should I do at home if I am diagnosed with toxoplasmosis?  Take over-the-counter and prescription medicines only as told by your health care provider.  If you were prescribed an antibiotic medicine, take it as told by your health care provider. Do not stop taking the antibiotic even if you start to feel better.  Keep all follow-up visits as told by your health care provider. This  is important. This information is not intended to replace advice given to you by your health care provider. Make sure you discuss any questions you have with your health care provider. Document Released: 07/17/2000 Document Revised: 12/09/2015 Document Reviewed: 11/14/2015 Elsevier Interactive Patient Education  2017 [employer] Minor Illnesses and Medications in Pregnancy  Cold/Flu:  Sudafed for congestion- Robitussin (plain) for cough- Tylenol for discomfort.  Please follow the directions on the label.  Try not to take any more than needed.  OTC Saline nasal spray and air humidifier or cool-mist  Vaporizer to sooth nasal irritation and to loosen congestion.  It is also important to increase intake of non carbonated fluids, especially if you   have a fever.  Constipation:  Colace-2 capsules at bedtime; Metamucil- follow directions on label; Senokot- 1 tablet at bedtime.  Any one of these medications can be used.  It is also very important to increase fluids and fruits along with regular exercise.  If problem persists please call the office.  Diarrhea:  Kaopectate as directed on the label.  Eat a bland diet and increase fluids.  Avoid highly seasoned foods.  Headache:  Tylenol 1 or 2 tablets every 3-4 hours as needed  Indigestion:  Maalox, Mylanta, Tums or Rolaids- as directed on label.  Also try to eat small meals and avoid fatty, greasy or spicy foods.  Nausea with or without Vomiting:  Nausea in pregnancy is caused by increased levels of hormones in the body which influence the digestive system and cause irritation when stomach acids accumulate.  Symptoms usually subside after 1st trimester of pregnancy.  Try the following: 1. Keep saltines, graham crackers or dry toast by your bed to eat upon awakening. 2. Don't let your stomach get empty.  Try to eat 5-6 small meals per day instead of 3 large ones. 3. Avoid greasy fatty or highly seasoned foods.  4. Take OTC Unisom 1 tablet at bed time  along with OTC Vitamin B6 25-50 mg 3 times per day.    If nausea continues with vomiting and you are unable to keep down food and fluids you may need a prescription medication.  Please notify your provider.   Sore throat:  Chloraseptic spray, throat lozenges and or plain Tylenol.  Vaginal Yeast Infection:  OTC Monistat for 7 days as directed on label.  If symptoms do not resolve within a week notify provider.  If any of the above problems do not subside with recommended treatment please call the office for further assistance.   Do not take Aspirin, Advil, Motrin or Ibuprofen.  * * OTC= Over the counter Hyperemesis Gravidarum Hyperemesis gravidarum is a severe form of nausea and vomiting that happens during pregnancy. Hyperemesis is worse than morning sickness. It may cause you to have nausea or vomiting all day for many days. It may keep you from eating and drinking enough food and liquids. Hyperemesis usually occurs during the first half (the first 20 weeks) of pregnancy. It often goes away once a woman is in her second half of pregnancy. However, sometimes hyperemesis continues through an entire pregnancy. What are the causes? The cause of this condition is not known. It may be related to changes in chemicals (hormones) in the body during pregnancy, such as the high level of pregnancy hormone (human chorionic gonadotropin) or the increase in the female sex hormone (estrogen). What are the signs or symptoms? Symptoms of this condition include:  Severe nausea and vomiting.  Nausea that does not go away.  Vomiting that does not allow you to keep any food down.  Weight loss.  Body fluid loss (dehydration).  Having no desire to eat, or not liking food that you have previously enjoyed. How is this diagnosed? This condition may be diagnosed based on:  A physical exam.  Your medical history.  Your symptoms.  Blood tests.  Urine tests. How is this treated? This condition may be  managed with medicine. If medicines to do not help relieve nausea and vomiting, you may need to receive fluids through an IV tube at the hospital. Follow these instructions at home:  Take over-the-counter and prescription medicines only as told by your health care provider.  Avoid iron   pills and multivitamins that contain iron for the first 3-4 months of pregnancy. If you take prescription iron pills, do not stop taking them unless your health care provider approves.  Take the following actions to help prevent nausea and vomiting:  In the morning, before getting out of bed, try eating a couple of dry crackers or a piece of toast.  Avoid foods and smells that upset your stomach. Fatty and spicy foods may make nausea worse.  Eat 5-6 small meals a day.  Do not drink fluids while eating meals. Drink between meals.  Eat or suck on things that have ginger in them. Ginger can help relieve nausea.  Avoid food preparation. The smell of food can spoil your appetite or trigger nausea.  Follow instructions from your health care provider about eating or drinking restrictions.  For snacks, eat high-protein foods, such as cheese.  Keep all follow-up and pre-birth (prenatal) visits as told by your health care provider. This is important. Contact a health care provider if:  You have pain in your abdomen.  You have a severe headache.  You have vision problems.  You are losing weight. Get help right away if:  You cannot drink fluids without vomiting.  You vomit blood.  You have constant nausea and vomiting.  You are very weak.  You are very thirsty.  You feel dizzy.  You faint.  You have a fever or other symptoms that last for more than 2-3 days.  You have a fever and your symptoms suddenly get worse. Summary  Hyperemesis gravidarum is a severe form of nausea and vomiting that happens during pregnancy.  Making some changes to your eating habits may help relieve nausea and  vomiting.  This condition may be managed with medicine.  If medicines to do not help relieve nausea and vomiting, you may need to receive fluids through an IV tube at the hospital. This information is not intended to replace advice given to you by your health care provider. Make sure you discuss any questions you have with your health care provider. Document Released: 04/10/2005 Document Revised: 12/08/2015 Document Reviewed: 12/08/2015 Elsevier Interactive Patient Education  2017 [employer] First Trimester of Pregnancy The first trimester of pregnancy is from week 1 until the end of week 12 (months 1 through 3). During this time, your baby will begin to develop inside you. At 6-8 weeks, the eyes and face are formed, and the heartbeat can be seen on ultrasound. At the end of 12 weeks, all the baby's organs are formed. Prenatal care is all the medical care you receive before the birth of your baby. Make sure you get good prenatal care and follow all of your doctor's instructions. Follow these instructions at home: Medicines  Take medicine only as told by your doctor. Some medicines are safe and some are not during pregnancy.  Take your prenatal vitamins as told by your doctor.  Take medicine that helps you poop (stool softener) as needed if your doctor says it is okay. Diet  Eat regular, healthy meals.  Your doctor will tell you the amount of weight gain that is right for you.  Avoid raw meat and uncooked cheese.  If you feel sick to your stomach (nauseous) or throw up (vomit):  Eat 4 or 5 small meals a day instead of 3 large meals.  Try eating a few soda crackers.  Drink liquids between meals instead of during meals.  If you have a hard time pooping (constipation):  Eat   high-fiber foods like fresh vegetables, fruit, and whole grains.  Drink enough fluids to keep your pee (urine) clear or pale yellow. Activity and Exercise  Exercise only as told by your doctor. Stop  exercising if you have cramps or pain in your lower belly (abdomen) or low back.  Try to avoid standing for long periods of time. Move your legs often if you must stand in one place for a long time.  Avoid heavy lifting.  Wear low-heeled shoes. Sit and stand up straight.  You can have sex unless your doctor tells you not to. Relief of Pain or Discomfort  Wear a good support bra if your breasts are sore.  Take warm water baths (sitz baths) to soothe pain or discomfort caused by hemorrhoids. Use hemorrhoid cream if your doctor says it is okay.  Rest with your legs raised if you have leg cramps or low back pain.  Wear support hose if you have puffy, bulging veins (varicose veins) in your legs. Raise (elevate) your feet for 15 minutes, 3-4 times a day. Limit salt in your diet. Prenatal Care  Schedule your prenatal visits by the twelfth week of pregnancy.  Write down your questions. Take them to your prenatal visits.  Keep all your prenatal visits as told by your doctor. Safety  Wear your seat belt at all times when driving.  Make a list of emergency phone numbers. The list should include numbers for family, friends, the hospital, and police and fire departments. General Tips  Ask your doctor for a referral to a local prenatal class. Begin classes no later than at the start of month 6 of your pregnancy.  Ask for help if you need counseling or help with nutrition. Your doctor can give you advice or tell you where to go for help.  Do not use hot tubs, steam rooms, or saunas.  Do not douche or use tampons or scented sanitary pads.  Do not cross your legs for long periods of time.  Avoid litter boxes and soil used by cats.  Avoid all smoking, herbs, and alcohol. Avoid drugs not approved by your doctor.  Do not use any tobacco products, including cigarettes, chewing tobacco, and electronic cigarettes. If you need help quitting, ask your doctor. You may get counseling or other  support to help you quit.  Visit your dentist. At home, brush your teeth with a soft toothbrush. Be gentle when you floss. Get help if:  You are dizzy.  You have mild cramps or pressure in your lower belly.  You have a nagging pain in your belly area.  You continue to feel sick to your stomach, throw up, or have watery poop (diarrhea).  You have a bad smelling fluid coming from your vagina.  You have pain with peeing (urination).  You have increased puffiness (swelling) in your face, hands, legs, or ankles. Get help right away if:  You have a fever.  You are leaking fluid from your vagina.  You have spotting or bleeding from your vagina.  You have very bad belly cramping or pain.  You gain or lose weight rapidly.  You throw up blood. It may look like coffee grounds.  You are around people who have German measles, fifth disease, or chickenpox.  You have a very bad headache.  You have shortness of breath.  You have any kind of trauma, such as from a fall or a car accident. This information is not intended to replace advice given to you by   your health care provider. Make sure you discuss any questions you have with your health care provider. Document Released: 09/27/2007 Document Revised: 09/16/2015 Document Reviewed: 02/18/2013 Elsevier Interactive Patient Education  2017 [employer] Commonly Asked Questions During Pregnancy  Cats: A parasite can be excreted in cat feces.  To avoid exposure you need to have another person empty the little box.  If you must empty the litter box you will need to wear gloves.  Wash your hands after handling your cat.  This parasite can also be found in raw or undercooked meat so this should also be avoided.  Colds, Sore Throats, Flu: Please check your medication sheet to see what you can take for symptoms.  If your symptoms are unrelieved by these medications please call the office.  Dental Work: Most any dental work your dentist  recommends is permitted.  X-rays should only be taken during the first trimester if absolutely necessary.  Your abdomen should be shielded with a lead apron during all x-rays.  Please notify your provider prior to receiving any x-rays.  Novocaine is fine; gas is not recommended.  If your dentist requires a note from us prior to dental work please call the office and we will provide one for you.  Exercise: Exercise is an important part of staying healthy during your pregnancy.  You may continue most exercises you were accustomed to prior to pregnancy.  Later in your pregnancy you will most likely notice you have difficulty with activities requiring balance like riding a bicycle.  It is important that you listen to your body and avoid activities that put you at a higher risk of falling.  Adequate rest and staying well hydrated are a must!  If you have questions about the safety of specific activities ask your provider.    Exposure to Children with illness: Try to avoid obvious exposure; report any symptoms to us when noted,  If you have chicken pos, red measles or mumps, you should be immune to these diseases.   Please do not take any vaccines while pregnant unless you have checked with your OB provider.  Fetal Movement: After 28 weeks we recommend you do "kick counts" twice daily.  Lie or sit down in a calm quiet environment and count your baby movements "kicks".  You should feel your baby at least 10 times per hour.  If you have not felt 10 kicks within the first hour get up, walk around and have something sweet to eat or drink then repeat for an additional hour.  If count remains less than 10 per hour notify your provider.  Fumigating: Follow your pest control agent's advice as to how long to stay out of your home.  Ventilate the area well before re-entering.  Hemorrhoids:   Most over-the-counter preparations can be used during pregnancy.  Check your medication to see what is safe to use.  It is important  to use a stool softener or fiber in your diet and to drink lots of liquids.  If hemorrhoids seem to be getting worse please call the office.   Hot Tubs:  Hot tubs Jacuzzis and saunas are not recommended while pregnant.  These increase your internal body temperature and should be avoided.  Intercourse:  Sexual intercourse is safe during pregnancy as long as you are comfortable, unless otherwise advised by your provider.  Spotting may occur after intercourse; report any bright red bleeding that is heavier than spotting.  Labor:  If you know that you are   in labor, please go to the hospital.  If you are unsure, please call the office and let us help you decide what to do.  Lifting, straining, etc:  If your job requires heavy lifting or straining please check with your provider for any limitations.  Generally, you should not lift items heavier than that you can lift simply with your hands and arms (no back muscles)  Painting:  Paint fumes do not harm your pregnancy, but may make you ill and should be avoided if possible.  Latex or water based paints have less odor than oils.  Use adequate ventilation while painting.  Permanents & Hair Color:  Chemicals in hair dyes are not recommended as they cause increase hair dryness which can increase hair loss during pregnancy.  " Highlighting" and permanents are allowed.  Dye may be absorbed differently and permanents may not hold as well during pregnancy.  Sunbathing:  Use a sunscreen, as skin burns easily during pregnancy.  Drink plenty of fluids; avoid over heating.  Tanning Beds:  Because their possible side effects are still unknown, tanning beds are not recommended.  Ultrasound Scans:  Routine ultrasounds are performed at approximately 20 weeks.  You will be able to see your baby's general anatomy an if you would like to know the gender this can usually be determined as well.  If it is questionable when you conceived you may also receive an ultrasound early  in your pregnancy for dating purposes.  Otherwise ultrasound exams are not routinely performed unless there is a medical necessity.  Although you can request a scan we ask that you pay for it when conducted because insurance does not cover " patient request" scans.  Work: If your pregnancy proceeds without complications you may work until your due date, unless your physician or employer advises otherwise.  Round Ligament Pain/Pelvic Discomfort:  Sharp, shooting pains not associated with bleeding are fairly common, usually occurring in the second trimester of pregnancy.  They tend to be worse when standing up or when you remain standing for long periods of time.  These are the result of pressure of certain pelvic ligaments called "round ligaments".  Rest, Tylenol and heat seem to be the most effective relief.  As the womb and fetus grow, they rise out of the pelvis and the discomfort improves.  Please notify the office if your pain seems different than that described.  It may represent a more serious condition.   

## 2016-06-05 NOTE — Progress Notes (Signed)
Kari DolinHannah W Conner presents for NOB nurse interview visit. Pregnancy confirmation done ON [facility]/2016 [facility] (see care everywhere). LMP: 04/16/2016 approx.  G-1.  P-0. Pregnancy education material explained and given. Pt states she gives out of energy, some shortness of breath, upon exertion, denies chest pain. Pt to observe these symptoms for any worsening and contact office or ER if needed.  C/O abdominal pain more, had heavy periods and cramping prior to pregnancy, no vaginal bleeding. Pt denies any symptoms of UTI. Pt encourage to either contact office or go to ER if severe abdominal pain, and/or vaginal bleeding. Will schedule ultrasound for dating and viability.  Has cat in the home.  Toxoplasmosis lab ordered. Pt does not normally change litter box but has some before she knew about pregnancy. NOB labs ordered. TSH/HbgA1c due to Increased BMI,  HIV labs and Drug screen were explained optional. Drug screen declined. PNV encouraged. Genetic screening options discussed. Genetic testing: Unsure. Patients uncle has hydrocephalus, CP, epilepsy.  Pt may discuss with provider. Pt. To follow up with provider in 2 weeks for NOB physical.  All questions answered.

## 2016-06-06 ENCOUNTER — Other Ambulatory Visit: Payer: Self-pay | Admitting: Obstetrics and Gynecology

## 2016-06-06 ENCOUNTER — Encounter: Payer: Self-pay | Admitting: Obstetrics and Gynecology

## 2016-06-06 ENCOUNTER — Ambulatory Visit (INDEPENDENT_AMBULATORY_CARE_PROVIDER_SITE_OTHER): Payer: Commercial Managed Care - PPO

## 2016-06-06 DIAGNOSIS — Z3400 Encounter for supervision of normal first pregnancy, unspecified trimester: Secondary | ICD-10-CM

## 2016-06-06 DIAGNOSIS — Z283 Underimmunization status: Secondary | ICD-10-CM

## 2016-06-06 DIAGNOSIS — Z3687 Encounter for antenatal screening for uncertain dates: Secondary | ICD-10-CM

## 2016-06-06 DIAGNOSIS — Z2839 Other underimmunization status: Secondary | ICD-10-CM | POA: Insufficient documentation

## 2016-06-06 DIAGNOSIS — O09899 Supervision of other high risk pregnancies, unspecified trimester: Secondary | ICD-10-CM | POA: Insufficient documentation

## 2016-06-06 LAB — HIV ANTIBODY (ROUTINE TESTING W REFLEX): HIV SCREEN 4TH GENERATION: NONREACTIVE

## 2016-06-06 LAB — ANTIBODY SCREEN: ANTIBODY SCREEN: NEGATIVE

## 2016-06-06 LAB — CBC WITH DIFFERENTIAL/PLATELET
BASOS ABS: 0 10*3/uL (ref 0.0–0.2)
BASOS: 0 %
EOS (ABSOLUTE): 0 10*3/uL (ref 0.0–0.4)
Eos: 0 %
Hematocrit: 41.5 % (ref 34.0–46.6)
Hemoglobin: 14 g/dL (ref 11.1–15.9)
Immature Grans (Abs): 0 10*3/uL (ref 0.0–0.1)
Immature Granulocytes: 0 %
LYMPHS: 20 %
Lymphocytes Absolute: 2.2 10*3/uL (ref 0.7–3.1)
MCH: 29.9 pg (ref 26.6–33.0)
MCHC: 33.7 g/dL (ref 31.5–35.7)
MCV: 89 fL (ref 79–97)
MONOS ABS: 0.7 10*3/uL (ref 0.1–0.9)
Monocytes: 7 %
Neutrophils Absolute: 7.8 10*3/uL — ABNORMAL HIGH (ref 1.4–7.0)
Neutrophils: 73 %
PLATELETS: 241 10*3/uL (ref 150–379)
RBC: 4.68 x10E6/uL (ref 3.77–5.28)
RDW: 13.6 % (ref 12.3–15.4)
WBC: 10.8 10*3/uL (ref 3.4–10.8)

## 2016-06-06 LAB — URINALYSIS, ROUTINE W REFLEX MICROSCOPIC
Bilirubin, UA: NEGATIVE
GLUCOSE, UA: NEGATIVE
Ketones, UA: NEGATIVE
Leukocytes, UA: NEGATIVE
NITRITE UA: NEGATIVE
PH UA: 5.5 (ref 5.0–7.5)
Protein, UA: NEGATIVE
RBC, UA: NEGATIVE
Specific Gravity, UA: 1.028 (ref 1.005–1.030)
UUROB: 0.2 mg/dL (ref 0.2–1.0)

## 2016-06-06 LAB — RH TYPE: Rh Factor: POSITIVE

## 2016-06-06 LAB — RUBELLA SCREEN: Rubella Antibodies, IGG: 1.4 index (ref >0.99)

## 2016-06-06 LAB — VARICELLA ZOSTER ANTIBODY, IGG

## 2016-06-06 LAB — TOXOPLASMA ANTIBODIES- IGG AND  IGM
Toxoplasma Antibody- IgM: <3 AU/mL (ref 0.0–7.9)
Toxoplasma IgG Ratio: <3 IU/mL (ref 0.0–7.1)

## 2016-06-06 LAB — RPR: RPR Ser Ql: NONREACTIVE

## 2016-06-06 LAB — ABO

## 2016-06-06 LAB — TSH: TSH: 1.23 u[IU]/mL (ref 0.450–4.500)

## 2016-06-06 LAB — HEPATITIS B SURFACE ANTIGEN: HEP B S AG: NEGATIVE

## 2016-06-06 LAB — HEMOGLOBIN A1C
ESTIMATED AVERAGE GLUCOSE: 91 mg/dL
Hgb A1c MFr Bld: 4.8 % (ref 4.8–5.6)

## 2016-06-07 LAB — GC/CHLAMYDIA PROBE AMP
CHLAMYDIA, DNA PROBE: NEGATIVE
NEISSERIA GONORRHOEAE BY PCR: NEGATIVE

## 2016-06-07 LAB — URINE CULTURE, OB REFLEX

## 2016-06-07 LAB — CULTURE, OB URINE

## 2016-06-16 ENCOUNTER — Telehealth: Payer: Self-pay | Admitting: Obstetrics and Gynecology

## 2016-06-16 NOTE — Telephone Encounter (Signed)
Called pt informed her that I would leave samples of Diclegis at the front desk for her with directions and coupon card. Informed pt to try Diclegis call back if it does not control sx. Also advised pt on ginger ale, saltine crackers, and small bland meals. Also sent pt education via mychart.

## 2016-06-16 NOTE — Telephone Encounter (Signed)
PT WAS SEEN LAST WEEK AND SHE IS PREGNANT AND AT THE TIME SHE WAS NOT HAVING ANY NAUSEA BUT NOW SHE IS HAVING REALLY BAD NAUSEA AND WANTED TO SEE IF SOMETHING CAN BE CALLED IN OR SAMPLES COULD BE GIVEN.

## 2016-06-16 NOTE — Telephone Encounter (Signed)
Pt called and she did want to know that if the diclegis did not work is it safe for her to take the zofran

## 2016-06-21 ENCOUNTER — Encounter: Payer: Commercial Managed Care - PPO | Admitting: Obstetrics and Gynecology

## 2016-06-26 ENCOUNTER — Telehealth: Payer: Self-pay | Admitting: Obstetrics and Gynecology

## 2016-06-26 DIAGNOSIS — O219 Vomiting of pregnancy, unspecified: Secondary | ICD-10-CM

## 2016-06-26 MED ORDER — DOXYLAMINE-PYRIDOXINE 10-10 MG PO TBEC: DELAYED_RELEASE_TABLET | ORAL | 3 refills | Status: DC

## 2016-06-26 NOTE — Telephone Encounter (Signed)
Rx sent in. Pt education sen to pt via mychart.

## 2016-06-26 NOTE — Telephone Encounter (Signed)
Pt received samples Diclegis.  She needs to get a prescription sent into the pharmacy.  Pt needs asap.  She is out.  She uses [facility].  Thanks Barth Kirkseri

## 2016-07-11 ENCOUNTER — Encounter: Payer: Self-pay | Admitting: Obstetrics and Gynecology

## 2016-07-11 ENCOUNTER — Ambulatory Visit (INDEPENDENT_AMBULATORY_CARE_PROVIDER_SITE_OTHER): Payer: Commercial Managed Care - PPO | Admitting: Obstetrics and Gynecology

## 2016-07-11 VITALS — BP 126/81 | HR 96 | Wt 193.7 lb

## 2016-07-11 DIAGNOSIS — O09899 Supervision of other high risk pregnancies, unspecified trimester: Secondary | ICD-10-CM

## 2016-07-11 DIAGNOSIS — Z283 Underimmunization status: Secondary | ICD-10-CM

## 2016-07-11 DIAGNOSIS — E669 Obesity, unspecified: Secondary | ICD-10-CM

## 2016-07-11 DIAGNOSIS — Z3401 Encounter for supervision of normal first pregnancy, first trimester: Secondary | ICD-10-CM

## 2016-07-11 DIAGNOSIS — O30041 Twin pregnancy, dichorionic/diamniotic, first trimester: Secondary | ICD-10-CM

## 2016-07-11 DIAGNOSIS — R110 Nausea: Secondary | ICD-10-CM

## 2016-07-11 DIAGNOSIS — O30042 Twin pregnancy, dichorionic/diamniotic, second trimester: Secondary | ICD-10-CM | POA: Insufficient documentation

## 2016-07-11 MED ORDER — FOLIC ACID 1 MG PO TABS: 1.0000 mg | ORAL_TABLET | Freq: Every day | ORAL | 10 refills | Status: DC

## 2016-07-11 NOTE — Progress Notes (Signed)
OBSTETRIC INITIAL PRENATAL VISIT  Subjective:    Kari Conner is being seen today for her first obstetrical visit.  This is not a planned pregnancy. She is a G1P0000 female at 4368w6d gestation with di-di twins, Estimated Date of Delivery: 01/24/17 with last menstrual period of 03/27/2016 (approximate), consistent with 7 week sono. Her obstetrical history is significant for obesity. Relationship with FOB: spouse, living together. Patient does intend to breast feed. Pregnancy history fully reviewed.    Obstetric History   G1   P0   T0   P0   A0   L0    SAB0   TAB0   Ectopic0   Multiple0   Live Births0     # Outcome Date GA Lbr Len/2nd Weight Sex Delivery Anes PTL Lv  1 Current               Gynecologic History:  Last pap smear was 01/2016.  Results were normal.  Denies h/o abnormal pap smears in the past.  Denies history of STIs.  Contraception: None.  Stopped taking OCPs in early part of 2017 because they made her "feel weird".    Past Medical History:  Diagnosis Date  . Amenorrhea   . History of ovarian cyst 12/07/2015    Family History  Problem Relation Age of Onset  . Hyperlipidemia Mother   . Hyperlipidemia Father   . Cancer Maternal Grandmother     lung    Past Surgical History:  Procedure Laterality Date  . TONSILLECTOMY    . WISDOM TOOTH EXTRACTION      Social History   Social History  . Marital status: Married    Spouse name: N/A  . Number of children: N/A  . Years of education: N/A   Occupational History  . Not on file.   Social History Main Topics  . Smoking status: Never Smoker  . Smokeless tobacco: Former NeurosurgeonUser  . Alcohol use Yes     Comment: social  . Drug use: No  . Sexual activity: Yes    Partners: Male    Birth control/ protection: None   Other Topics Concern  . Not on file   Social History Narrative  . No narrative on file    Current Outpatient Prescriptions on File Prior to Visit  Medication Sig Dispense Refill  .  Doxylamine-Pyridoxine (DICLEGIS) 10-10 MG TBEC Take by mouth, up to 4 times daily as directed. 120 tablet 3  . prenatal vitamin w/FE, FA (PRENATAL 1 + 1) 27-1 MG TABS tablet Take 1 tablet by mouth daily at 12 noon.     No current facility-administered medications on file prior to visit.     No Known Allergies   Review of Systems General:Not Present- Fever, Weight Loss and Weight Gain. Skin:Not Present- Rash. HEENT:Not Present- Blurred Vision, Headache and Bleeding Gums. Respiratory:Not Present- Difficulty Breathing. Breast:Not Present- Breast Mass. Cardiovascular:Not Present- Chest Pain, Elevated Blood Pressure, Fainting / Blacking Out and Shortness of Breath. Gastrointestinal:Present - Nausea, mild.  Not Present- Abdominal Pain, Constipation, Nausea. Female Genitourinary:Not Present- Frequency, Painful Urination, Pelvic Pain, Vaginal Bleeding, Vaginal Discharge, Contractions, regular, Fetal Movements Decreased, Urinary Complaints and Vaginal Fluid. Musculoskeletal:Not Present- Back Pain and Leg Cramps. Neurological:Not Present- Dizziness. Psychiatric:Not Present- Depression.     Objective:   Blood pressure 126/81, pulse 96, weight 193 lb 11.2 oz (87.9 kg), last menstrual period 03/27/2016.  Body mass index is 30.8 kg/m.  General Appearance:    Alert, cooperative, no distress, appears stated age  Head:    Normocephalic, without obvious abnormality, atraumatic  Eyes:    PERRL, conjunctiva/corneas clear, EOM's intact, both eyes  Ears:    Normal external ear canals, both ears  Nose:   Nares normal, septum midline, mucosa normal, no drainage or sinus tenderness  Throat:   Lips, mucosa, and tongue normal; teeth and gums normal  Neck:   Supple, symmetrical, trachea midline, no adenopathy; thyroid: no enlargement/tenderness/nodules; no carotid bruit or JVD  Back:     Symmetric, no curvature, ROM normal, no CVA tenderness  Lungs:     Clear to auscultation bilaterally,  respirations unlabored  Chest Wall:    No tenderness or deformity   Heart:    Regular rate and rhythm, S1 and S2 normal, no murmur, rub or gallop  Breast Exam:    No tenderness, masses, or nipple abnormality  Abdomen:     Soft, non-tender, bowel sounds active all four quadrants, no masses, no organomegaly.  FH 13.  FHT 161 (twin A)/158 (Twin B) bpm.  Genitalia:    Pelvic:external genitalia normal, vagina without lesions, discharge, or tenderness, rectovaginal septum  normal. Cervix normal in appearance, no cervical motion tenderness, no adnexal masses or tenderness.  Pregnancy positive findings: uterine enlargement: 13 wk size, nontender.   Rectal:    Normal external sphincter.  No hemorrhoids appreciated. Internal exam not done.   Extremities:   Extremities normal, atraumatic, no cyanosis or edema  Pulses:   2+ and symmetric all extremities  Skin:   Skin color, texture, turgor normal, no rashes or lesions  Lymph nodes:   Cervical, supraclavicular, and axillary nodes normal  Neurologic:   CNII-XII intact, normal strength, sensation and reflexes throughout     Assessment:   Pregnancy at 11 and 6/7 weeks   Twin pregnancy (dichorionic, diamniotic) Obesity Nausea in Pregnancy   Plan:   1. Pregnancy at 11 and 6/7 weeks   - Initial labs reviewed. Declined urine drug screen.  Varicella non-immune, will need vaccination postpartum.  - Pap smear up to date. - Prenatal vitamins encouraged. - Problem list reviewed and updated. - New OB counseling:  The patient has been given an overview regarding routine prenatal care.  - Prenatal testing, optional genetic testing, and ultrasound use in pregnancy were reviewed.  Patient unsure if she desires 1st trimester screen or cell-free DNA testing.  Handout given on both.  Patient to call back once decision was made.  - Benefits of Breast Feeding were discussed. The patient is encouraged to consider nursing her baby post partum.  2. Twin pregnancy  (dichorionic, diamniotic) - Discussed increased ultrasound surveillance for growth after [redacted] weeks gestation (usually q 4-6 weeks).   - Patient should increase folic acid intake to 1 mg daily.  - Will discuss delivery plan further in the pregnancy (can have SVD as long as Twin A is vertex).   3. Obesity - Patient will need early glucola for obesity in pregnancy (BMI >30). To perform by next visit.  - Recommendations regarding diet, weight gain, and exercise in pregnancy were given.  4. Nausea in Pregnancy - Patient controlling with dietary modifications and Dicelgis.   Follow up in 4 weeks.   50% of 30 min visit spent on counseling and coordination of care.     Hildred Laser, MD [facility]

## 2016-07-11 NOTE — Patient Instructions (Addendum)
Multiple Pregnancy Having a multiple pregnancy means that a woman is carrying more than one baby at a time. She may be pregnant with twins, triplets, or more. The majority of multiple pregnancies are twins. Naturally conceiving triplets or more (higher-order multiples) is rare. Multiple pregnancies are riskier than single pregnancies. A woman with a multiple pregnancy is more likely to have certain problems during her pregnancy. Therefore, she will need to have more frequent appointments for prenatal care. How does a multiple pregnancy happen? A multiple pregnancy happens when:  The woman's body releases more than one egg at a time, and then each egg gets fertilized by a different sperm.  This is the most common type of multiple pregnancy.  Twins or other multiples produced this way are fraternal. They are no more alike than non-multiple siblings are.  One sperm fertilizes one egg, which then divides into more than one embryo.  Twins or other multiples produced this way are identical. Identical multiples are always the same gender, and they look very much alike. Who is most likely to have a multiple pregnancy? A multiple pregnancy is more likely to develop in women who:  Have had fertility treatment, especially if the treatment included fertility drugs.  Are older than 23 years of age.  Have already had four or more children.  Have a family history of multiple pregnancy. How is a multiple pregnancy diagnosed? A multiple pregnancy may be diagnosed based on:  Symptoms such as:  Rapid weight gain in the first 3 months of pregnancy (first trimester).  More severe nausea and breast tenderness than what is typical of a single pregnancy.  The uterus measuring larger than what is normal for the stage of the pregnancy.  Blood tests that detect a higher-than-normal level of human chorionic gonadotropin (hCG). This is a hormone that your body produces in early pregnancy.  Ultrasound exam.  This is used to confirm that you are carrying multiples. What risks are associated with multiple pregnancy? A multiple pregnancy puts you at a higher risk for certain problems during or after your pregnancy, including:  Having your babies delivered before you have reached a full-term pregnancy (preterm birth). A full-term pregnancy lasts for at least 37 weeks. Babies born before 37 weeks may have a higher risk of a variety of health problems, such as breathing problems, feeding difficulties, cerebral palsy, and learning disabilities.  Diabetes.  Preeclampsia. This is a serious condition that causes high blood pressure along with other symptoms, such as swelling and headaches, during pregnancy.  Excessive blood loss after childbirth (postpartum hemorrhage).  Postpartum depression.  Low birth weight of the babies. How will having a multiple pregnancy affect my care? Your health care provider will want to monitor you more closely during your pregnancy to make sure that your babies are growing normally and that you are healthy. Follow these instructions at home: Because your pregnancy is considered to be high risk, you will need to work closely with your health care team. You may also need to make some lifestyle changes. These may include the following: Eating and drinking   Increase your nutrition.  Follow your health care provider's recommendations for weight gain. You may need to gain a little extra weight when you are pregnant with multiples.  Eat healthy snacks often throughout the day. This can add calories and reduce nausea.  Drink enough fluid to keep your urine clear or pale yellow.  Take prenatal vitamins. Activity  By 20-24 weeks, you may need to   limit your activities.  Avoid activities and work that take a lot of effort (are strenuous).  Ask your health care provider when you should stop having sexual intercourse.  Rest often. General instructions   Do not use any  products that contain nicotine or tobacco, such as cigarettes and e-cigarettes. If you need help quitting, ask your health care provider.  Do not drink alcohol or use illegal drugs.  Take over-the-counter and prescription medicines only as told by your health care provider.  Arrange for extra help around the house.  Keep all follow-up visits and all prenatal visits as told by your health care provider. This is important. Contact a health care provider if:  You have dizziness.  You have persistent nausea, vomiting, or diarrhea.  You are having trouble gaining weight.  You have feelings of depression or other emotions that are interfering with your normal activities. Get help right away if:  You have a fever.  You have pain with urination.  You have fluid leaking from your vagina.  You have a bad-smelling vaginal discharge.  You notice increased swelling in your face, hands, legs, or ankles.  You have spotting or bleeding from your vagina.  You have pelvic cramps, pelvic pressure, or nagging pain in your abdomen or lower back.  You are having regular contractions.  You develop a severe headache, with or without visual changes.  You have shortness of breath or chest pain.  You notice less fetal movement, or no fetal movement. This information is not intended to replace advice given to you by your health care provider. Make sure you discuss any questions you have with your health care provider. Document Released: 01/18/2008 Document Revised: 12/10/2015 Document Reviewed: 12/10/2015 Elsevier Interactive Patient Education  2017 [employer]  

## 2016-07-12 ENCOUNTER — Telehealth: Payer: Self-pay

## 2016-07-12 NOTE — Telephone Encounter (Signed)
-----   Message from Hildred LaserAnika Cherry, MD sent at 07/11/2016  8:13 PM EDT ----- Regarding: folic acid Please inform patient that due to twin gestation I would for her to increase her folic acid intake to 1 mg (the usual dose is 0.4 mg for singleton pregnancy).  We will probably need to prescribe this.

## 2016-07-17 ENCOUNTER — Encounter: Payer: Self-pay | Admitting: Obstetrics and Gynecology

## 2016-07-17 ENCOUNTER — Telehealth: Payer: Self-pay | Admitting: Obstetrics and Gynecology

## 2016-07-17 NOTE — Telephone Encounter (Signed)
Called pt she states that she has had diarrhea and vomiting x 3 days and now has abdominal pain is calling because she is concerned. Advised pt this is likely a stomach virus and has to run it's course. Advised pt on safe medications in pregnancy for diarrhea and vomiting. Also given pt note for work. Advised pt on adequate hydration (I.e. Pedialyte, Gatorade) small sips and BRAT diet.

## 2016-07-17 NOTE — Telephone Encounter (Signed)
Pt called states 12 week gest. Pt states diarrhea & vomiting x 3d no fever. Pt states abdominal pain. Pt is at work now but states unsure if contagious. Pt desire re call.  [phone].

## 2016-07-28 ENCOUNTER — Encounter: Payer: Self-pay | Admitting: Obstetrics and Gynecology

## 2016-08-08 ENCOUNTER — Encounter: Payer: Commercial Managed Care - PPO | Admitting: Obstetrics and Gynecology

## 2016-08-08 ENCOUNTER — Encounter: Payer: Self-pay | Admitting: Obstetrics and Gynecology

## 2016-08-08 ENCOUNTER — Other Ambulatory Visit: Payer: Commercial Managed Care - PPO

## 2016-08-10 ENCOUNTER — Other Ambulatory Visit: Payer: Commercial Managed Care - PPO

## 2016-08-10 ENCOUNTER — Encounter: Payer: Commercial Managed Care - PPO | Admitting: Obstetrics and Gynecology

## 2016-08-10 ENCOUNTER — Ambulatory Visit (INDEPENDENT_AMBULATORY_CARE_PROVIDER_SITE_OTHER): Payer: Commercial Managed Care - PPO | Admitting: Obstetrics and Gynecology

## 2016-08-10 ENCOUNTER — Other Ambulatory Visit: Payer: Self-pay | Admitting: Obstetrics and Gynecology

## 2016-08-10 ENCOUNTER — Encounter: Payer: Self-pay | Admitting: Obstetrics and Gynecology

## 2016-08-10 VITALS — BP 118/76 | HR 103 | Wt 198.1 lb

## 2016-08-10 DIAGNOSIS — O30042 Twin pregnancy, dichorionic/diamniotic, second trimester: Secondary | ICD-10-CM

## 2016-08-10 DIAGNOSIS — E669 Obesity, unspecified: Secondary | ICD-10-CM

## 2016-08-10 LAB — POCT URINALYSIS DIPSTICK
Bilirubin, UA: NEGATIVE
GLUCOSE UA: NEGATIVE
Ketones, UA: NEGATIVE
Leukocytes, UA: NEGATIVE
Nitrite, UA: NEGATIVE
Protein, UA: NEGATIVE
SPEC GRAV UA: 1.01 (ref 1.010–1.025)
UROBILINOGEN UA: NEGATIVE U/dL — AB
pH, UA: 7.5 (ref 5.0–8.0)

## 2016-08-10 NOTE — Progress Notes (Signed)
ROB:  Occ SOB with exertion - discussed.  FAS scheduled.  Pt has decided to have Panarama and AFP today.  Doing early 1hr GCT today.

## 2016-08-11 LAB — GLUCOSE, 1 HOUR GESTATIONAL: Gestational Diabetes Screen: 93 mg/dL (ref 65–139)

## 2016-08-15 ENCOUNTER — Ambulatory Visit (INDEPENDENT_AMBULATORY_CARE_PROVIDER_SITE_OTHER): Payer: Commercial Managed Care - PPO | Admitting: Obstetrics and Gynecology

## 2016-08-15 ENCOUNTER — Telehealth: Payer: Self-pay | Admitting: Obstetrics and Gynecology

## 2016-08-15 VITALS — BP 131/79 | HR 106 | Wt 198.8 lb

## 2016-08-15 DIAGNOSIS — R399 Unspecified symptoms and signs involving the genitourinary system: Secondary | ICD-10-CM

## 2016-08-15 DIAGNOSIS — M545 Low back pain, unspecified: Secondary | ICD-10-CM

## 2016-08-15 DIAGNOSIS — O30042 Twin pregnancy, dichorionic/diamniotic, second trimester: Secondary | ICD-10-CM

## 2016-08-15 LAB — AFP, SERUM, OPEN SPINA BIFIDA
AFP MoM: 1.44
AFP Value: 39.8 ng/mL
GEST. AGE ON COLLECTION DATE: 16 wk
Maternal Age At EDD: 22.7 yr
OSBR RISK 1 IN: 6549
TEST RESULTS AFP: NEGATIVE
WEIGHT: 198 [lb_av]

## 2016-08-15 LAB — POCT URINALYSIS DIPSTICK
Bilirubin, UA: NEGATIVE
Blood, UA: NEGATIVE
GLUCOSE UA: NEGATIVE
Ketones, UA: NEGATIVE
LEUKOCYTES UA: NEGATIVE
NITRITE UA: NEGATIVE
PROTEIN UA: NEGATIVE
Spec Grav, UA: 1.01 (ref 1.010–1.025)
UROBILINOGEN UA: 0.2 U/dL
pH, UA: 8 (ref 5.0–8.0)

## 2016-08-15 NOTE — Telephone Encounter (Signed)
Pt seen in office, 08/15/2016.

## 2016-08-15 NOTE — Telephone Encounter (Signed)
Possible UTI - pain, back pain cloudy urine Please call asap  Does she ned appointment to come in or urine drop off?

## 2016-08-15 NOTE — Progress Notes (Signed)
Problem OB: Patient complains of mild right sided back pain.  Thinks that she may have a UTI.  Denies hematuria, dysuria.  UA negative today.  Discussed adequate hydration, Tylenol prn, warm compress to back, increasing fluids, decreasing caffeine and sodas, and can take Azo/cranberry juice if urinary discomfort is present. RTC in 4 weeks for regularly scheduled visit.  Panorama performed today.

## 2016-08-16 LAB — URINALYSIS, ROUTINE W REFLEX MICROSCOPIC
BILIRUBIN UA: NEGATIVE
Glucose, UA: NEGATIVE
KETONES UA: NEGATIVE
LEUKOCYTES UA: NEGATIVE
NITRITE UA: NEGATIVE
Protein, UA: NEGATIVE
RBC UA: NEGATIVE
SPEC GRAV UA: 1.014 (ref 1.005–1.030)
Urobilinogen, Ur: 0.2 mg/dL (ref 0.2–1.0)
pH, UA: 8 — ABNORMAL HIGH (ref 5.0–7.5)

## 2016-08-17 ENCOUNTER — Emergency Department: Payer: Commercial Managed Care - PPO

## 2016-08-17 ENCOUNTER — Encounter: Payer: Self-pay | Admitting: Emergency Medicine

## 2016-08-17 ENCOUNTER — Emergency Department
Admission: EM | Admit: 2016-08-17 | Discharge: 2016-08-17 | Disposition: A | Discharge status: Home / Self Care | Payer: Commercial Managed Care - PPO | Attending: Emergency Medicine | Admitting: Emergency Medicine

## 2016-08-17 DIAGNOSIS — R102 Pelvic and perineal pain: Secondary | ICD-10-CM | POA: Insufficient documentation

## 2016-08-17 DIAGNOSIS — R1031 Right lower quadrant pain: Secondary | ICD-10-CM | POA: Diagnosis not present

## 2016-08-17 DIAGNOSIS — O26892 Other specified pregnancy related conditions, second trimester: Secondary | ICD-10-CM | POA: Diagnosis not present

## 2016-08-17 DIAGNOSIS — Z3A17 17 weeks gestation of pregnancy: Secondary | ICD-10-CM | POA: Insufficient documentation

## 2016-08-17 DIAGNOSIS — O219 Vomiting of pregnancy, unspecified: Secondary | ICD-10-CM | POA: Insufficient documentation

## 2016-08-17 DIAGNOSIS — Z87891 Personal history of nicotine dependence: Secondary | ICD-10-CM | POA: Insufficient documentation

## 2016-08-17 DIAGNOSIS — N949 Unspecified condition associated with female genital organs and menstrual cycle: Secondary | ICD-10-CM

## 2016-08-17 DIAGNOSIS — M549 Dorsalgia, unspecified: Secondary | ICD-10-CM | POA: Diagnosis not present

## 2016-08-17 LAB — URINALYSIS, COMPLETE (UACMP) WITH MICROSCOPIC
BILIRUBIN URINE: NEGATIVE
Glucose, UA: NEGATIVE mg/dL
Hgb urine dipstick: NEGATIVE
Ketones, ur: NEGATIVE mg/dL
Leukocytes, UA: NEGATIVE
NITRITE: NEGATIVE
PH: 7 (ref 5.0–8.0)
Protein, ur: NEGATIVE mg/dL
Specific Gravity, Urine: 1.008 (ref 1.005–1.030)

## 2016-08-17 LAB — BASIC METABOLIC PANEL
ANION GAP: 8 (ref 5–15)
BUN: 7 mg/dL (ref 6–20)
CALCIUM: 9.8 mg/dL (ref 8.9–10.3)
CO2: 25 mmol/L (ref 22–32)
Chloride: 105 mmol/L (ref 101–111)
Creatinine, Ser: 0.54 mg/dL (ref 0.44–1.00)
GFR calc Af Amer: >60 mL/min (ref >60)
GFR calc non Af Amer: >60 mL/min (ref >60)
GLUCOSE: 85 mg/dL (ref 65–99)
POTASSIUM: 3.7 mmol/L (ref 3.5–5.1)
Sodium: 138 mmol/L (ref 135–145)

## 2016-08-17 LAB — URINE CULTURE

## 2016-08-17 LAB — HCG, QUANTITATIVE, PREGNANCY: hCG, Beta Chain, Quant, S: 50426 m[IU]/mL — ABNORMAL HIGH (ref <5)

## 2016-08-17 LAB — CBC
HCT: 34.2 % — ABNORMAL LOW (ref 35.0–47.0)
Hemoglobin: 11.8 g/dL — ABNORMAL LOW (ref 12.0–16.0)
MCH: 30.8 pg (ref 26.0–34.0)
MCHC: 34.6 g/dL (ref 32.0–36.0)
MCV: 89 fL (ref 80.0–100.0)
PLATELETS: 202 10*3/uL (ref 150–440)
RBC: 3.84 MIL/uL (ref 3.80–5.20)
RDW: 14.4 % (ref 11.5–14.5)
WBC: 12 10*3/uL — ABNORMAL HIGH (ref 3.6–11.0)

## 2016-08-17 LAB — PREGNANCY, URINE: Preg Test, Ur: POSITIVE — AB

## 2016-08-17 MED ORDER — ACETAMINOPHEN 500 MG PO TABS
ORAL_TABLET | ORAL | Status: AC
  Filled 2016-08-17: qty 2

## 2016-08-17 MED ORDER — ACETAMINOPHEN 500 MG PO TABS
1000.0000 mg | ORAL_TABLET | Freq: Once | ORAL | Status: AC
  Administered 2016-08-17: 1000 mg via ORAL

## 2016-08-17 NOTE — ED Notes (Signed)
Patient c/o lower right sided back pain and lower right abd pain that began last night (08/16/16). Patient currently [redacted] weeks pregnant with twins. Saw OB last yesterday. States tested negative for UTI.

## 2016-08-17 NOTE — ED Triage Notes (Addendum)
Patient ambulatory to triage with steady gait, without difficulty or distress noted; pt reports right flank pain radiating into right lower abd since yesterday with urinary frequency; went to [facility] and had urine checked for UTI but negative; twin pregnancy, EDC 9/30, G1 with no complications; denies any vag bleeding or discharge

## 2016-08-17 NOTE — ED Provider Notes (Signed)
[facility] Emergency Department Provider Note   ____________________________________________   First MD Initiated Contact with Patient 08/17/16 [phone]     (approximate)  I have reviewed the triage vital signs and the nursing notes.   HISTORY  Chief Complaint Abdominal Pain    HPI Kari Conner is a 21 y.o. female who comes into the hospital today with some right-sided flank pain. The patient reports that she went to bed around 10 PM and she couldn't sleep due to pain. She reports that she's having pain in her right lower abdomen that goes into her back. The patient reports tried a heating pad as well as 1000 mg of Tylenol. Nothing seemed to help. The patient reports that she saw her OB/GYN yesterday around 11 AM and they checked her for UTI. They told her that she did not have a UTI and this pain was likely from growing pains. The patient is [redacted] weeks pregnant with a twinge of station. She reports that she does have a history of an ovarian cyst but reports that this painstarted yesterday. The patient has had some nausea and vomiting with no fevers. The patient reports that she walked a little bit and it seemed to be worse after she walked but it was worse when she was trying to go to sleep. The patient reports that she just could not tolerate the pain so she decided to come into the hospital for evaluation. This is the patient's first pregnancy and she's never had anything like this before.   Past Medical History:  Diagnosis Date  . Amenorrhea   . History of ovarian cyst 12/07/2015    Patient Active Problem List   Diagnosis Date Noted  . 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 Obesity (BMI 30.0-34.9) 07/11/2016  . Maternal varicella, non-immune 06/06/2016  . History of ovarian cyst 12/07/2015    Past Surgical History:  Procedure Laterality Date  . TONSILLECTOMY    . WISDOM TOOTH EXTRACTION      Prior to Admission  medications   Medication Sig Start Date End Date Taking? Authorizing Provider  Doxylamine-Pyridoxine (DICLEGIS) 10-10 MG TBEC Take by mouth, up to 4 times daily as directed. 06/26/16   Hildred Laser, MD  folic acid (FOLVITE) 1 MG tablet Take 1 tablet (1 mg total) by mouth daily. 07/11/16   Hildred Laser, MD  prenatal vitamin w/FE, FA (PRENATAL 1 + 1) 27-1 MG TABS tablet Take 1 tablet by mouth daily at 12 noon.    Historical Provider, MD    Allergies Patient has no known allergies.  Family History  Problem Relation Age of Onset  . Hypertension Mother   . Hypertension Father   . Cancer Maternal Grandmother     lung    Social History Social History  Substance Use Topics  . Smoking status: Never Smoker  . Smokeless tobacco: Former Neurosurgeon  . Alcohol use No     Comment: social    Review of Systems  Constitutional: No fever/chills Eyes: No visual changes. ENT: No sore throat. Cardiovascular: Denies chest pain. Respiratory: Denies shortness of breath. Gastrointestinal:  abdominal pain.   nausea, vomiting.  No diarrhea.  No constipation. Genitourinary: Negative for dysuria. Musculoskeletal:  back pain. Skin: Negative for rash. Neurological: Negative for headaches, focal weakness or numbness.   ____________________________________________   PHYSICAL EXAM:  VITAL SIGNS: ED Triage Vitals  Enc Vitals Group     BP 08/17/16 0207 (!) 153/92     Pulse  Rate 08/17/16 0207 (!) 103     Resp 08/17/16 0207 18     Temp 08/17/16 0207 98 F (36.7 C)     Temp Source 08/17/16 0207 Oral     SpO2 08/17/16 0207 100 %     Weight 08/17/16 0208 198 lb (89.8 kg)     Height 08/17/16 0208  (1.676 m)     Head Circumference --      Peak Flow --      Pain Score 08/17/16 0207 8     Pain Loc --      Pain Edu? --      Excl. in GC? --     Constitutional: Alert and oriented. Well appearing and in Mild distress. Eyes: Conjunctivae are normal. PERRL. EOMI. Head: Atraumatic. Nose: No  congestion/rhinnorhea. Mouth/Throat: Mucous membranes are moist.  Oropharynx non-erythematous. Cardiovascular: Normal rate, regular rhythm. Grossly normal heart sounds.  Good peripheral circulation. Respiratory: Normal respiratory effort.  No retractions. Lungs CTAB. Gastrointestinal: Soft right lower quadrant abdominal pain. No distention. Positive bowel sounds with some right-sided CVA tenderness to palpation Musculoskeletal: No lower extremity tenderness nor edema.  Neurologic:  Normal speech and language.  Skin:  Skin is warm, dry and intact. Marland Kitchen Psychiatric: Mood and affect are normal.   ____________________________________________   LABS (all labs ordered are listed, but only abnormal results are displayed)  Labs Reviewed  URINALYSIS, COMPLETE (UACMP) WITH MICROSCOPIC - Abnormal; Notable for the following:       Result Value   Color, Urine YELLOW (*)    APPearance HAZY (*)    Bacteria, UA RARE (*)    Squamous Epithelial / LPF 0-5 (*)    All other components within normal limits  CBC - Abnormal; Notable for the following:    WBC 12.0 (*)    Hemoglobin 11.8 (*)    HCT 34.2 (*)    All other components within normal limits  HCG, QUANTITATIVE, PREGNANCY - Abnormal; Notable for the following:    hCG, Beta Chain, Quant, S 50,426 (*)    All other components within normal limits  PREGNANCY, URINE - Abnormal; Notable for the following:    Preg Test, Ur POSITIVE (*)    All other components within normal limits  BASIC METABOLIC PANEL   ____________________________________________  EKG  none ____________________________________________  RADIOLOGY  US OB US renal ____________________________________________   PROCEDURES  Procedure(s) performed: None  Procedures  Critical Care performed: No  ____________________________________________   INITIAL IMPRESSION / ASSESSMENT AND PLAN / ED COURSE  Pertinent labs & imaging results that were available during my care of the  patient were reviewed by me and considered in my medical decision making (see chart for details).  This is a 21 year old female who comes into the hospital today with some right-sided flank and lower abdominal pain. The patient had an ultrasound of her twins which were unremarkable. The patient's urinalysis and blood work is also unremarkable. I will send the patient for renal ultrasound to evaluate possible kidney stone. I will look for hydronephrosis or lack of a right ureteral jet. The patient did receive some Tylenol in triage and she reports that it did actually help with her pain. The patient has no complaints at this time. We will reassess the patient once I received the results of her ultrasound.  Clinical Course as of Aug 17 736  Thu Aug 17, 2016  0603 Twin live intrauterine pregnancy with an estimated gestational age of [redacted] weeks, 6 days for both twins.  No acute findings identified.  This exam is performed on an emergent basis and does not comprehensively evaluate fetal size, dating, or anatomy; follow-up complete OB US should be considered if further fetal assessment is warranted   US OB Limited [AW]    Clinical Course User Index [AW] Rebecka Apley, MD   The patient's care was signed out to Dr. Mayford Knife will follow-up the results of the ultrasound and disposition the patient.  ____________________________________________   FINAL CLINICAL IMPRESSION(S) / ED DIAGNOSES  Final diagnoses:  Right lower quadrant abdominal pain  Round ligament pain      NEW MEDICATIONS STARTED DURING THIS VISIT:  New Prescriptions   No medications on file     Note:  This document was prepared using Dragon voice recognition software and may include unintentional dictation errors.    Rebecka Apley, MD 08/17/16 [phone]

## 2016-08-17 NOTE — ED Notes (Signed)
Patient transported to Ultrasound 

## 2016-08-17 NOTE — ED Notes (Signed)
Pt resting in bed, pt given warm blanket and lights dimmed, family at bedside, discussed pending [place]

## 2016-08-17 NOTE — ED Provider Notes (Signed)
IMPRESSION: Right hydronephrosis is noted but a right ureteral jet is identified in the bladder. Right hydronephrosis may be seen during pregnancy. A right ureteral jet supports patency of the right ureter.  No evidence of left hydronephrosis.  Patient will be encouraged to take Tylenol for pain, she is stable for outpatient follow-up.   Kari Filbert, MD 08/17/16 [phone]

## 2016-08-17 NOTE — ED Notes (Signed)
Pt returned from US, resting in bed in no distress 

## 2016-09-01 ENCOUNTER — Other Ambulatory Visit: Payer: Self-pay | Admitting: Obstetrics and Gynecology

## 2016-09-01 DIAGNOSIS — Z369 Encounter for antenatal screening, unspecified: Secondary | ICD-10-CM

## 2016-09-01 DIAGNOSIS — O30002 Twin pregnancy, unspecified number of placenta and unspecified number of amniotic sacs, second trimester: Secondary | ICD-10-CM

## 2016-09-05 ENCOUNTER — Ambulatory Visit (INDEPENDENT_AMBULATORY_CARE_PROVIDER_SITE_OTHER): Payer: Commercial Managed Care - PPO | Admitting: Obstetrics and Gynecology

## 2016-09-05 ENCOUNTER — Ambulatory Visit (INDEPENDENT_AMBULATORY_CARE_PROVIDER_SITE_OTHER): Payer: Commercial Managed Care - PPO

## 2016-09-05 VITALS — BP 122/75 | HR 101 | Wt 204.4 lb

## 2016-09-05 DIAGNOSIS — O30042 Twin pregnancy, dichorionic/diamniotic, second trimester: Secondary | ICD-10-CM

## 2016-09-05 DIAGNOSIS — O30002 Twin pregnancy, unspecified number of placenta and unspecified number of amniotic sacs, second trimester: Secondary | ICD-10-CM | POA: Diagnosis not present

## 2016-09-05 DIAGNOSIS — Z369 Encounter for antenatal screening, unspecified: Secondary | ICD-10-CM | POA: Diagnosis not present

## 2016-09-05 LAB — POCT URINALYSIS DIPSTICK
Bilirubin, UA: NEGATIVE
GLUCOSE UA: NEGATIVE
Ketones, UA: NEGATIVE
NITRITE UA: NEGATIVE
Protein, UA: NEGATIVE
UROBILINOGEN UA: 0.2 U/dL
pH, UA: 7 (ref 5.0–8.0)

## 2016-09-05 NOTE — Progress Notes (Signed)
ROB: Patient doing well, no complaints.  Normal anatomy scan x 2. Patient notes that she is planning to [facility] as she is very afraid of having her twins early and having them "rushed to another hospital where she would not be with them). Discussed concerns with patient, reassured that her care would be optimized for the twins no matter where she delivered (as she was low risk with di-di twins).  Patient still desires transfer.  Will sign medical release.  Informed patient to keep next 4 week appointment unless [facility] during [facility]. Will fax referral letter ([facility] she is a low-risk twin pregnancy).    Hildred Laserherry, Zophia Marrone, MD [facility]

## 2016-10-04 ENCOUNTER — Encounter: Payer: Commercial Managed Care - PPO | Admitting: Obstetrics and Gynecology

## 2017-01-30 ENCOUNTER — Encounter: Payer: Managed Care, Other (non HMO) | Admitting: Obstetrics and Gynecology

## 2017-04-18 ENCOUNTER — Encounter (HOSPITAL_COMMUNITY): Payer: Self-pay

## 2017-07-11 ENCOUNTER — Encounter: Payer: Self-pay | Admitting: Obstetrics and Gynecology

## 2017-07-11 ENCOUNTER — Ambulatory Visit (INDEPENDENT_AMBULATORY_CARE_PROVIDER_SITE_OTHER): Payer: Commercial Managed Care - PPO | Admitting: Obstetrics and Gynecology

## 2017-07-11 VITALS — BP 113/69 | HR 86 | Ht 66.0 in | Wt 191.9 lb

## 2017-07-11 DIAGNOSIS — Z3009 Encounter for other general counseling and advice on contraception: Secondary | ICD-10-CM | POA: Diagnosis not present

## 2017-07-11 MED ORDER — MISOPROSTOL 200 MCG PO TABS: ORAL_TABLET | ORAL | 2 refills | Status: DC

## 2017-07-11 NOTE — Progress Notes (Signed)
Pt wants to know more about her birth control option.

## 2017-07-11 NOTE — Progress Notes (Signed)
   GYNECOLOGY CLINIC PROGRESS NOTE Subjective:    Kari Conner is a 22 y.o. 641P1002 female who presents for contraception counseling. The patient has no complaints today. The patient is sexually active. Pertinent past medical history: none.  Menstrual History: OB History    Gravida Para Term Preterm AB Living   1 1 1  0 0 2   SAB TAB Ectopic Multiple Live Births   0 0 0 1 2      Menarche age: [age] Patient's last menstrual period was 06/27/2017.    The following portions of the patient's history were reviewed and updated as appropriate: allergies, current medications, past family history, past medical history, past social history, past surgical history and problem list.  Review of Systems A comprehensive review of systems was negative.   Objective:    BP 113/69   Pulse 86   Ht 5\' 6"  (1.676 m)   Wt 191 lb 14.4 oz (87 kg)   LMP 06/27/2017   Breastfeeding? No   BMI 30.97 kg/m  General appearance: alert and no distress Abdomen: soft, non-tender; bowel sounds normal; no masses,  no organomegaly. Well-healed Pfannenstiel incision.  Pelvic: deferred   Assessment:   Contraception counseling  Plan:   Reviewed all forms of birth control options available including abstinence; fertility period awareness methods; over the counter/barrier methods; hormonal contraceptive medication including pill, patch, ring, injection,contraceptive implant; hormonal and nonhormonal IUDs; permanent sterilization options including vasectomy and the various tubal sterilization modalities were not discussed. Risks and benefits reviewed. Patient desires Kari BouchardKyleena IUD.  Questions were answered.  Information was given to patient to review. She will return in 4-7 days for insertion (opted for patient to wait until around next menstrual cycle for insertion, but desires sooner). Will prescribe Cytotec to aid insertion. Counseled on use of Ibuprofen for cramping. Advised on abstinence from now until next visit.     A total of 15 minutes were spent face-to-face with the patient during this encounter and over half of that time dealt with counseling and coordination of care.   Hildred Laserherry, Jadelin Eng, MD [facility]

## 2017-07-20 ENCOUNTER — Ambulatory Visit: Payer: Commercial Managed Care - PPO | Admitting: Obstetrics and Gynecology

## 2017-08-29 ENCOUNTER — Encounter: Payer: Self-pay | Admitting: Obstetrics and Gynecology

## 2017-08-29 ENCOUNTER — Ambulatory Visit (INDEPENDENT_AMBULATORY_CARE_PROVIDER_SITE_OTHER): Payer: Commercial Managed Care - PPO | Admitting: Obstetrics and Gynecology

## 2017-08-29 VITALS — BP 106/71 | HR 85 | Ht 66.0 in | Wt 188.8 lb

## 2017-08-29 DIAGNOSIS — Z30430 Encounter for insertion of intrauterine contraceptive device: Secondary | ICD-10-CM | POA: Diagnosis not present

## 2017-08-29 LAB — POCT URINE PREGNANCY: Preg Test, Ur: NEGATIVE

## 2017-08-29 NOTE — Progress Notes (Signed)
     GYNECOLOGY OFFICE PROCEDURE NOTE  Kari Conner is a 22 y.o. G1P1002 here for [facility] IUD insertion. No GYN concerns.  Last pap smear was on 01/2016 and was normal.  Patient's last menstrual period was 08/24/2017.   IUD Insertion Procedure Note Patient identified, informed consent performed, consent signed.   Discussed risks of irregular bleeding, cramping, infection, malpositioning or misplacement of the IUD outside the uterus which may require further procedure such as laparoscopy. Time out was performed.  Urine pregnancy test not done as patient within menses window.  Speculum placed in the vagina.  Cervix visualized.  Cleaned with Betadine x 2.  Grasped anteriorly with a single tooth tenaculum.  Uterus sounded to 7 cm.  KyleenaIUD placed per manufacturer's recommendations.  Strings trimmed to 3 cm. Tenaculum was removed, good hemostasis noted.  Patient tolerated procedure well.   Patient was given post-procedure instructions.  She was advised to have backup contraception for one week.  Patient was also asked to check IUD strings periodically and follow up in 4 weeks for IUD check.

## 2017-08-29 NOTE — Patient Instructions (Signed)

## 2017-08-29 NOTE — Progress Notes (Signed)
Pt is present today for kyleena insertion.

## 2017-08-29 NOTE — Addendum Note (Signed)
Addended by: Silvano Bilis on: 08/29/2017 11:58 AM   Modules accepted: Orders

## 2017-09-26 ENCOUNTER — Encounter: Payer: Self-pay | Admitting: Obstetrics and Gynecology

## 2017-09-26 ENCOUNTER — Ambulatory Visit (INDEPENDENT_AMBULATORY_CARE_PROVIDER_SITE_OTHER): Payer: Commercial Managed Care - PPO | Admitting: Obstetrics and Gynecology

## 2017-09-26 VITALS — BP 101/69 | HR 78 | Ht 66.0 in | Wt 189.4 lb

## 2017-09-26 DIAGNOSIS — Z30431 Encounter for routine checking of intrauterine contraceptive device: Secondary | ICD-10-CM | POA: Diagnosis not present

## 2017-09-26 NOTE — Progress Notes (Signed)
Pt is present today for her scheduled IUD string check. Pt stated that she is not having any problems with the IUD. Pt stated that she is doing well.

## 2017-09-26 NOTE — Progress Notes (Signed)
    GYNECOLOGY OFFICE PROGRESS NOTE  History:  22 y.o. [id number] here today for today for IUD string check;  IUD was placed  08/2017. No complaints about the IUD, no concerning side effects.  The following portions of the patient's history were reviewed and updated as appropriate: allergies, current medications, past family history, past medical history, past social history, past surgical history and problem list. Last pap smear on 01/2016 was normal, negative HRHPV.  Review of Systems:  Pertinent items are noted in HPI.   Objective:  Physical Exam Blood pressure 101/69, pulse 78, height 5\' 6"  (1.676 m), weight 189 lb 6.4 oz (85.9 kg), not currently breastfeeding. CONSTITUTIONAL: Well-developed, well-nourished female in no acute distress.  HENT:  Normocephalic, atraumatic. External right and left ear normal. Oropharynx is clear and moist ABDOMEN: Soft, no distention noted.   PELVIC: Normal appearing external genitalia; normal appearing vaginal mucosa and cervix.  IUD strings visualized, about 3 cm in length outside cervix.  EXTREMITIES: extremities normal, atraumatic, no cyanosis or edema NEUROLOGIC: Grossly normal  Assessment & Plan:  Normal IUD check. Patient to keep IUD in place for five years; can come in for removal if she desires pregnancy within the next five years. Routine preventative health maintenance measures emphasized.    Hildred Laserherry, Ming Mcmannis, MD [facility]

## 2018-05-03 ENCOUNTER — Encounter: Payer: Self-pay | Admitting: Obstetrics and Gynecology

## 2018-05-03 ENCOUNTER — Ambulatory Visit (INDEPENDENT_AMBULATORY_CARE_PROVIDER_SITE_OTHER): Payer: Commercial Managed Care - PPO | Admitting: Obstetrics and Gynecology

## 2018-05-03 ENCOUNTER — Encounter

## 2018-05-03 VITALS — BP 112/77 | HR 90 | Ht 66.0 in | Wt 188.1 lb

## 2018-05-03 DIAGNOSIS — F329 Major depressive disorder, single episode, unspecified: Secondary | ICD-10-CM | POA: Insufficient documentation

## 2018-05-03 DIAGNOSIS — F3289 Other specified depressive episodes: Secondary | ICD-10-CM

## 2018-05-03 DIAGNOSIS — Z975 Presence of (intrauterine) contraceptive device: Secondary | ICD-10-CM

## 2018-05-03 DIAGNOSIS — F32A Depression, unspecified: Secondary | ICD-10-CM | POA: Insufficient documentation

## 2018-05-03 NOTE — Progress Notes (Signed)
    GYNECOLOGY PROGRESS NOTE  Subjective:    Patient ID: Kari Conner, female    DOB: Sep 30, 1995, 23 y.o.   MRN: [id number]  HPI  Patient is a 23 y.o. G22P1002 female who presents for concerns regarding her Rutha Bouchard  IUD.  She is noting mood symptoms, feeling depressed. Notes onset right around the time she had her IUD placed.  She is reporting feeling sadness, some appetite changes and sleep issues (however does also note that she has obstructive sleep apnea which she is planning to have surgery for).  She looked up if her symptoms could be related to her IUD and notes she found an association. States that she would really like to keep her IUD if possible, but is afraid that she will need anti-depressant medications which she does not want to use.   The following portions of the patient's history were reviewed and updated as appropriate: allergies, current medications, past family history, past medical history, past social history, past surgical history and problem list.  Review of Systems Pertinent items noted in HPI and remainder of comprehensive ROS otherwise negative.   Objective:   Blood pressure 112/77, pulse 90, height 5\' 6"  (1.676 m), weight 188 lb 1.6 oz (85.3 kg). General appearance: cooperative and no distress Psychological: normal appearing mood and affect, normal speech and thought content. Neurologic: Grossly intact   Assessment:   Depression IUD in place  Plan:   PHQ-9 score is 14 today (however several of the questions regarding sleep and energy may be skewed due to patient's history of sleep apnea). Discussion had on options, including decreasing to lower dose hormonal IUD (such as Skyla), a non-hormonal IUD, or discontinuing IUDs and go to low-dose OCP. Also discussed use of natural remedies to help with mood boosting, and offered counseling which patient notes she is ok to do.  Will refer. Would prefer not to be on anti-depressants if possible.   A total of 15  minutes were spent face-to-face with the patient during this encounter and over half of that time dealt with counseling and coordination of care.   Hildred Laser, MD [facility]

## 2018-05-03 NOTE — Progress Notes (Signed)
Pt stated that she is having symptoms that she think is caused by the IUD and wanted to discuss the issues before getting the IUD removed.  PHQ-9=14.

## 2018-05-03 NOTE — Patient Instructions (Signed)
Herbal Remedies to Boost Mood  1. Chase tree berry (Vitrex) 2. St. John's Wort 3. SAM-E

## 2018-05-17 ENCOUNTER — Other Ambulatory Visit: Payer: Self-pay

## 2018-05-17 MED ORDER — FLUCONAZOLE 150 MG PO TABS: 150.0000 mg | ORAL_TABLET | Freq: Once | ORAL | 0 refills | Status: AC

## 2018-08-01 ENCOUNTER — Encounter: Payer: Commercial Managed Care - PPO | Admitting: Obstetrics and Gynecology

## 2018-10-29 IMAGING — US US RENAL
1 series · 14 of 25 positions shown · non-contrast
Comparison: None.

CLINICAL DATA: Pregnant with twins.  Right flank pain.

EXAM:
RENAL / URINARY TRACT ULTRASOUND COMPLETE

[Series 1: us renal · 0.23mm/px · 14 of 31 slices shown]
[im 1/31]
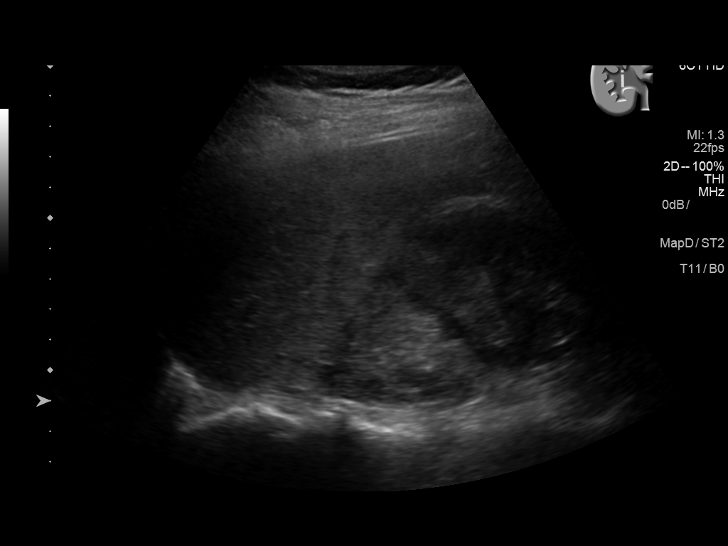
[im 3/31]
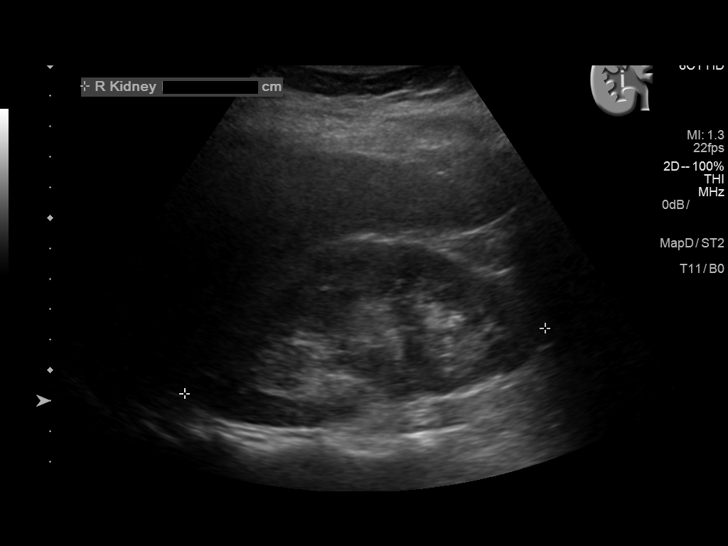
[im 6/31]
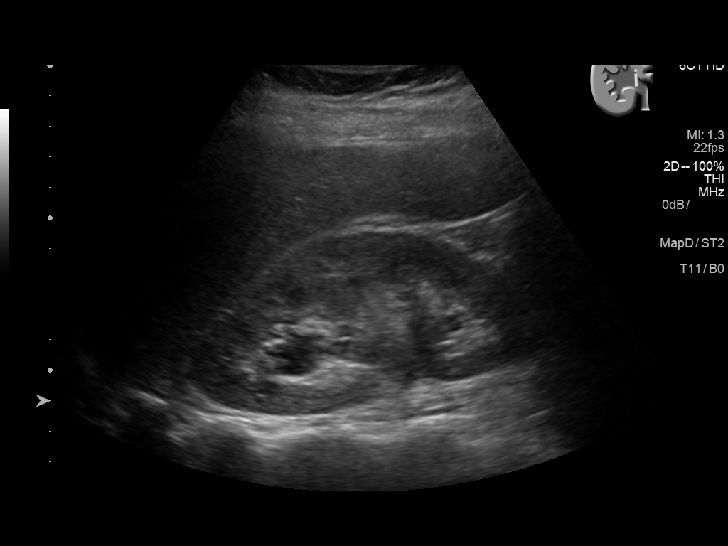
[im 8/31]
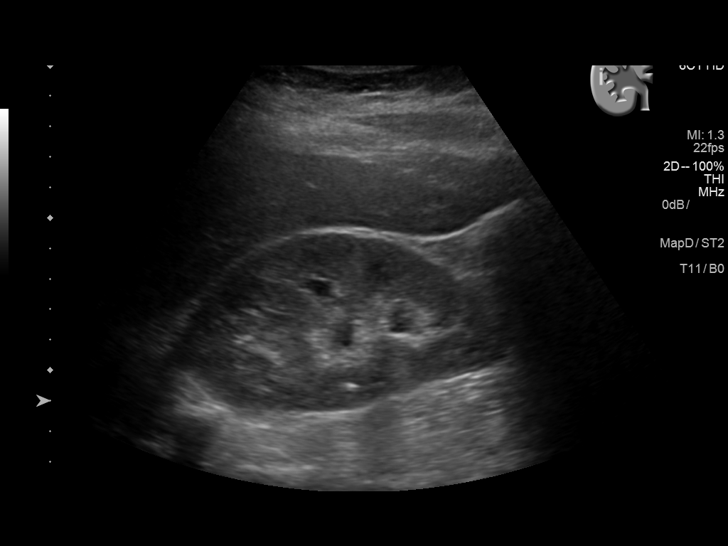
[im 11/31]
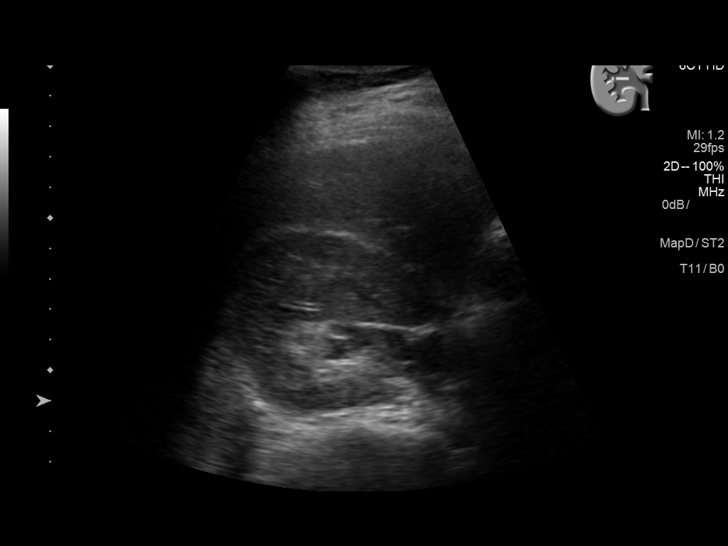
[im 12/31]
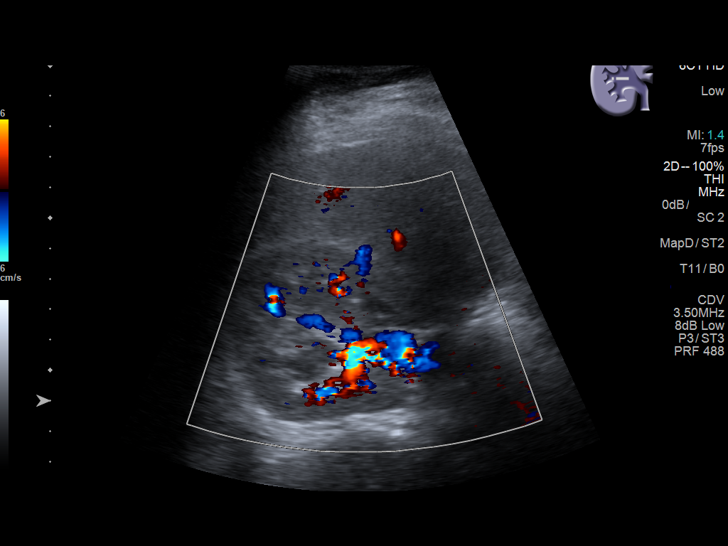
[im 14/31]
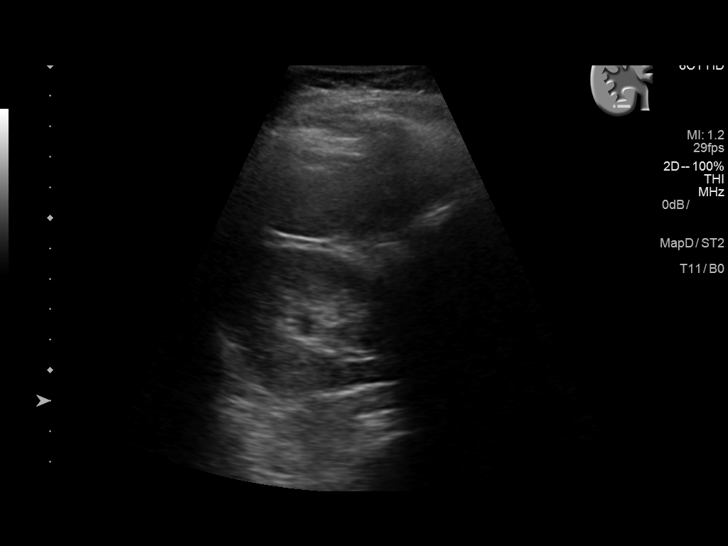
[im 17/31]
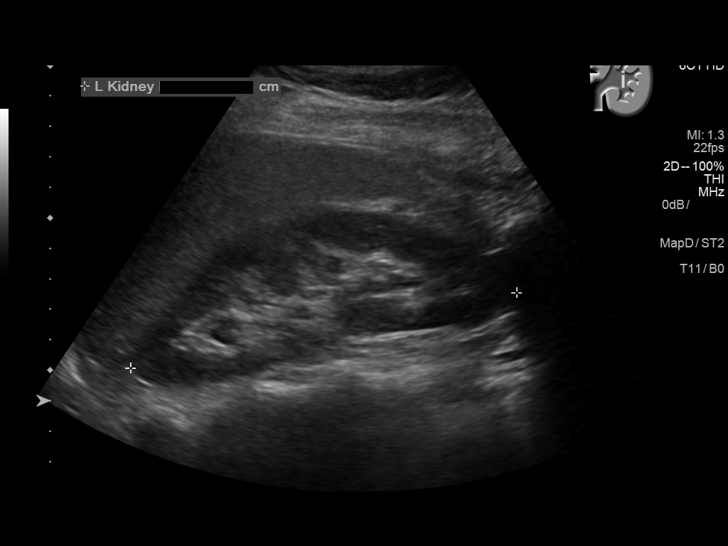
[im 19/31]
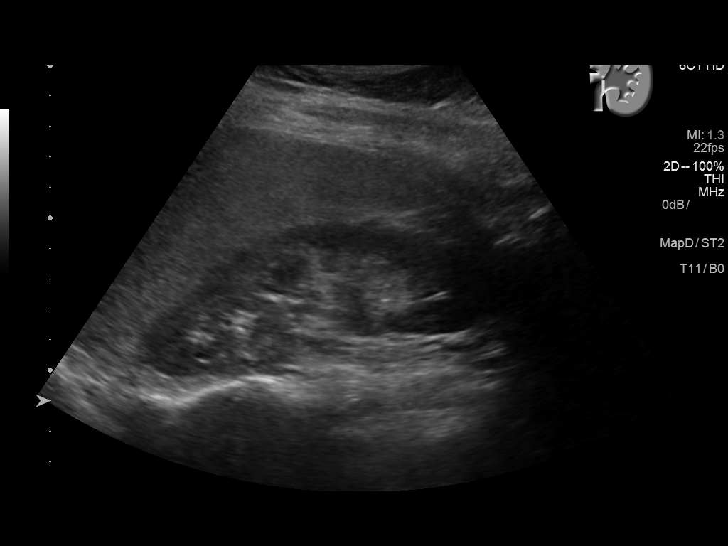
[im 21/31]
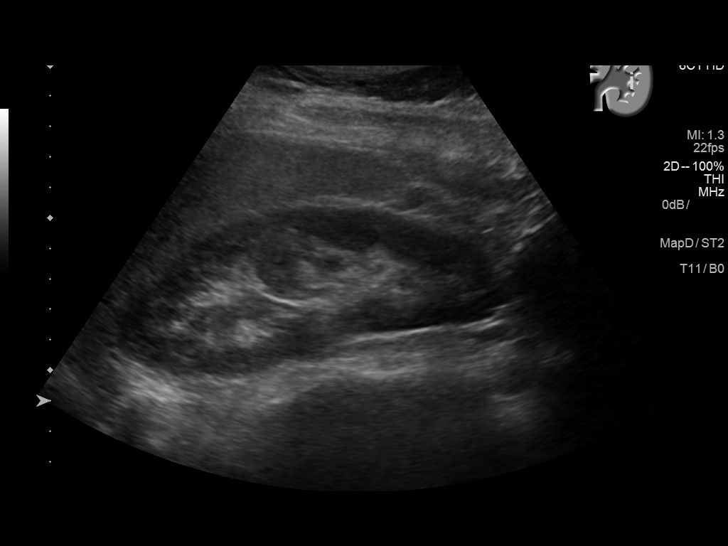
[im 23/31]
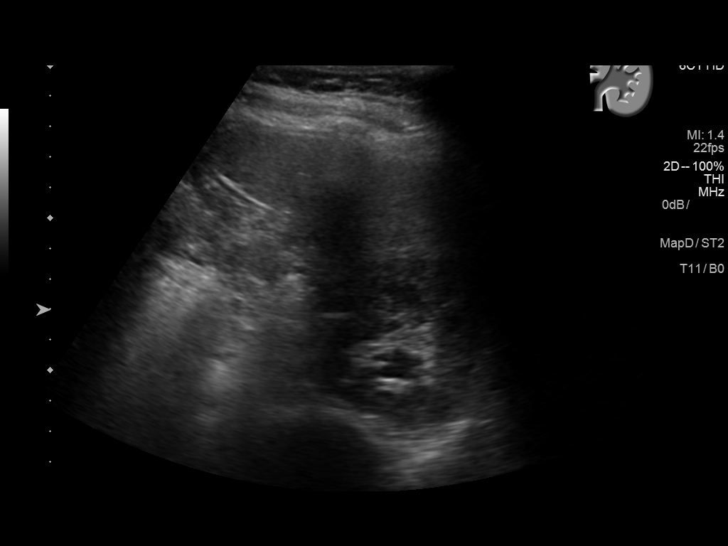
[im 26/31]
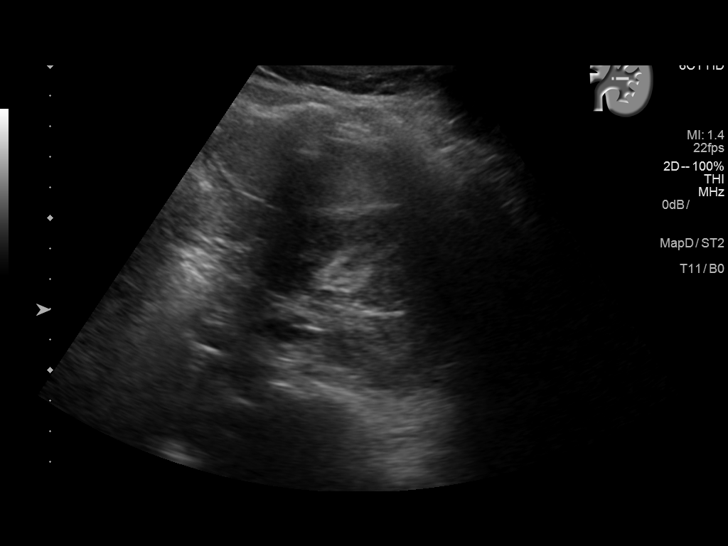
[im 28/31]
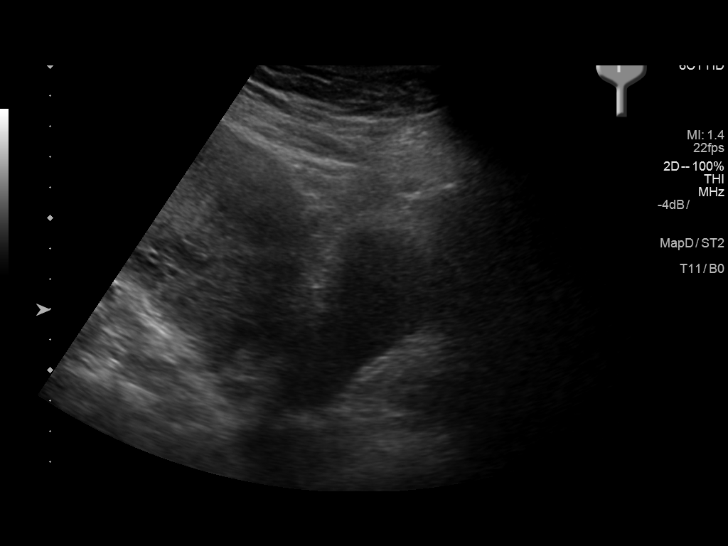
[im 31/31]
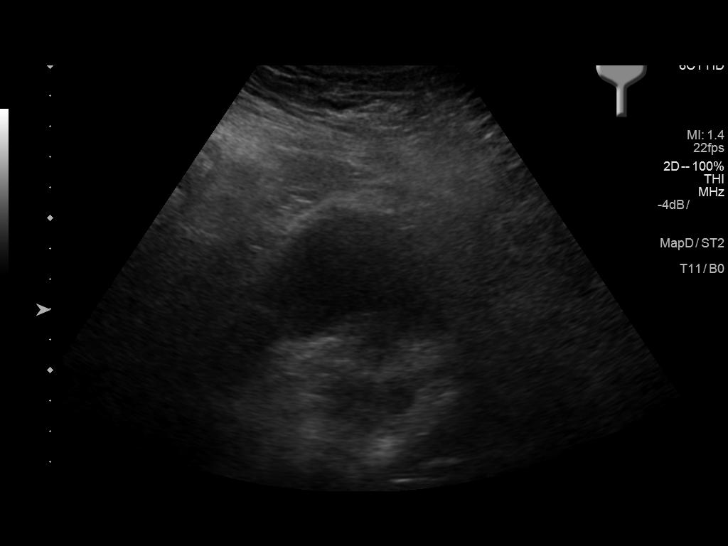

[14 of 25 positions shown; findings below may reference images not displayed]

FINDINGS: Right Kidney:

Length: 12.0 cm. Echogenicity within normal limits. No mass. Mild
hydronephrosis.

Left Kidney:

Length: 12.9 cm. Echogenicity within normal limits. No mass or
hydronephrosis visualized.

Bladder:

Right ureteral jet is documented.  Bladder is unremarkable.
IMPRESSION: Right hydronephrosis is noted but a right ureteral jet is identified
in the bladder. Right hydronephrosis may be seen during pregnancy. A
right ureteral jet supports patency of the right ureter.

No evidence of left hydronephrosis.

## 2018-12-06 ENCOUNTER — Other Ambulatory Visit: Payer: Self-pay

## 2018-12-06 DIAGNOSIS — Z20822 Contact with and (suspected) exposure to covid-19: Secondary | ICD-10-CM

## 2018-12-07 LAB — NOVEL CORONAVIRUS, NAA: SARS-CoV-2, NAA: NOT DETECTED

## 2019-02-25 ENCOUNTER — Ambulatory Visit (INDEPENDENT_AMBULATORY_CARE_PROVIDER_SITE_OTHER): Payer: Commercial Managed Care - PPO

## 2019-02-25 ENCOUNTER — Other Ambulatory Visit: Payer: Self-pay

## 2019-02-25 ENCOUNTER — Ambulatory Visit: Payer: Commercial Managed Care - PPO | Admitting: Obstetrics and Gynecology

## 2019-02-25 ENCOUNTER — Encounter: Payer: Self-pay | Admitting: Obstetrics and Gynecology

## 2019-02-25 VITALS — BP 114/76 | HR 87 | Ht 66.0 in | Wt 189.3 lb

## 2019-02-25 DIAGNOSIS — R102 Pelvic and perineal pain: Secondary | ICD-10-CM

## 2019-02-25 DIAGNOSIS — Z975 Presence of (intrauterine) contraceptive device: Secondary | ICD-10-CM | POA: Diagnosis not present

## 2019-02-25 LAB — POCT URINE PREGNANCY: Preg Test, Ur: NEGATIVE

## 2019-02-25 NOTE — Progress Notes (Signed)
Pt stated having cramping for 3-4 weeks continually. Pt currently has IUD [place]. Pt thinks it maybe coming from the IUD. Pt requested UPT. UPT neg.

## 2019-02-25 NOTE — Progress Notes (Signed)
    GYNECOLOGY PROGRESS NOTE  Subjective:    Patient ID: Kari Conner, female    DOB: 1996-01-12, 23 y.o.   MRN: [id number]  HPI  Patient is a 23 y.o. G61P1002 female who presents for complaints of pelvic cramping x 3-4 weeks with IUD in place.  Notes that Ibuprofen does help, however was concerned as it had never done that before. Currently has [place] IUD in place. Denies abnormal bleeding, rarely has a menstrual cycle on IUD.  Desires UPT today.   The following portions of the patient's history were reviewed and updated as appropriate: allergies, current medications, past family history, past medical history, past social history, past surgical history and problem list.  Review of Systems Pertinent items noted in HPI and remainder of comprehensive ROS otherwise negative.   Objective:   Blood pressure 114/76, pulse 87, height 5\' 6"  (1.676 m), weight 189 lb 4.8 oz (85.9 kg). General appearance: alert and no distress Abdomen: soft, non-tender; bowel sounds normal; no masses,  no organomegaly Pelvic:external genitalia normal, rectovaginal septum normal.  Vagina with moderate thin white discharge.  Cervix normal appearing, no lesions and no motion tenderness.  IUD threads visible, ~ 3 cm in length.  Uterus mobile, nontender, normal shape and size.  Adnexae non-palpable, nontender bilaterally.    Labs:  UPT negative.    Imaging:  Patient Name: Kari Conner DOB: 1995/09/27 MRN: [id number] ULTRASOUND REPORT  Location: [facility]  Date of Service: 02/25/2019     Indications:IUD Findings:  The uterus is anteverted and measures 7.3 x 3.9 x 5.0 cm. Echo texture is homogenous without evidence of focal masses. * The Endometrium measures 5 mm IUD appropriately position within the endometrial cavity..  Right Ovary measures 3.0 x 2.2 x 1.9 cm. It is normal in appearance. Left Ovary measures 2.4 x 1.4 x 1.7 cm. It is normal in appearance. Survey of the adnexa demonstrates no  adnexal masses. There is no free fluid in the cul de sac.  Impression: 1. IUD appropriately position within the endometrial cavity. Recommendations: 1.Clinical correlation with the patient's History and Physical Exam.   Jenine M. Alessi    RDMS   Assessment:   IUD in place  Pelvic cramping  Plan:   - IUD visualized today. Strings of appropriate length.  Given reassurance.  Advised on use of Ibuprofen or Tylenol for cramping. Ultrasound done today noting good positioning.  RTC as needed.    Rubie Maid, MD [facility]

## 2019-05-28 ENCOUNTER — Encounter: Payer: Commercial Managed Care - PPO | Admitting: Obstetrics and Gynecology

## 2019-06-17 ENCOUNTER — Telehealth: Payer: Self-pay | Admitting: Obstetrics and Gynecology

## 2019-06-17 NOTE — Telephone Encounter (Signed)
Spoke with pt concerning her UTI sx. Pt c/o pain with urination. Pt was advised to increase her water intake, do not hold her urine to urinated when she feels the urge, drink cranberry juice and take tylenol or ibuprofen for the pain.

## 2019-06-17 NOTE — Telephone Encounter (Signed)
Patient called again to follow-up about possibly making an appointment for today for her dysuria.

## 2019-06-17 NOTE — Telephone Encounter (Signed)
Pt called in and stated that she has an appt for tommorow but that she feel uncomfortable. Pt stated that she thinks it might be a uti. Pt is experiencing pain while urinating feeling like she has to go all the time. The pt is requesting a call back. Please advise

## 2019-06-18 ENCOUNTER — Other Ambulatory Visit: Payer: Self-pay

## 2019-06-18 ENCOUNTER — Ambulatory Visit (INDEPENDENT_AMBULATORY_CARE_PROVIDER_SITE_OTHER): Payer: Commercial Managed Care - PPO | Admitting: Obstetrics and Gynecology

## 2019-06-18 ENCOUNTER — Other Ambulatory Visit (HOSPITAL_COMMUNITY)
Admission: RE | Admit: 2019-06-18 | Discharge: 2019-06-18 | Disposition: A | Discharge status: Home / Self Care | Payer: Commercial Managed Care - PPO | Source: Ambulatory Visit | Attending: Obstetrics and Gynecology | Admitting: Obstetrics and Gynecology

## 2019-06-18 ENCOUNTER — Encounter: Payer: Self-pay | Admitting: Obstetrics and Gynecology

## 2019-06-18 VITALS — BP 109/66 | HR 90 | Ht 66.0 in | Wt 182.0 lb

## 2019-06-18 DIAGNOSIS — Z01419 Encounter for gynecological examination (general) (routine) without abnormal findings: Secondary | ICD-10-CM | POA: Insufficient documentation

## 2019-06-18 DIAGNOSIS — Z975 Presence of (intrauterine) contraceptive device: Secondary | ICD-10-CM

## 2019-06-18 DIAGNOSIS — R399 Unspecified symptoms and signs involving the genitourinary system: Secondary | ICD-10-CM

## 2019-06-18 DIAGNOSIS — Z124 Encounter for screening for malignant neoplasm of cervix: Secondary | ICD-10-CM

## 2019-06-18 DIAGNOSIS — Z8659 Personal history of other mental and behavioral disorders: Secondary | ICD-10-CM

## 2019-06-18 DIAGNOSIS — E663 Overweight: Secondary | ICD-10-CM

## 2019-06-18 LAB — POCT URINALYSIS DIPSTICK
Bilirubin, UA: NEGATIVE
Glucose, UA: NEGATIVE
Ketones, UA: NEGATIVE
Leukocytes, UA: NEGATIVE
Nitrite, UA: NEGATIVE
Protein, UA: NEGATIVE
Spec Grav, UA: >=1.03 — AB (ref 1.010–1.025)
Urobilinogen, UA: 0.2 E.U./dL
pH, UA: 6 (ref 5.0–8.0)

## 2019-06-18 MED ORDER — SULFAMETHOXAZOLE-TRIMETHOPRIM 800-160 MG PO TABS: 1.0000 | ORAL_TABLET | Freq: Two times a day (BID) | ORAL | 0 refills | Status: DC

## 2019-06-18 NOTE — Progress Notes (Signed)
Pt present for annual exam. Pt c/o uti symptoms; pain with urination, back pain and abd pain since 06/16/19. Pt's last pap 01/26/16 pap due. Pt declined flu vaccine. Urine culture completed Pt stated that she was doing well other wise.

## 2019-06-18 NOTE — Progress Notes (Signed)
GYNECOLOGY ANNUAL PHYSICAL EXAM PROGRESS NOTE  Subjective:    Kari Conner is a 24 y.o. married G0P0000 female who presents for an annual exam.  The patient is sexually active. The patient wears seatbelts: yes. The patient participates in regular exercise: no. Has the patient ever been transfused or tattooed?: no  The patient has the following complaints today: 1. UTI symptoms noted since Monday. Notes tingling sensation with urination, back pain, urinary urgency. Took Azo this morning and has been trying to increase fluids.  2. Recently underwent jaw surgery in January for treatment in sleep apnea.  3. Would like to have a referral to a counselor. Not having any active issues with depression but just would like to have a counselor.    Gynecologic History Menarche age: 31 No LMP recorded. (Menstrual status: IUD). Contraception: none History of STI's: Denies  Last Pap: 01/2016. Results were: normal.    OB History  Gravida Para Term Preterm AB Living  1 1 1  0 0 2  SAB TAB Ectopic Multiple Live Births  0 0 0 1 2    # Outcome Date GA Lbr Len/2nd Weight Sex Delivery Anes PTL Lv  1A Term 2018    F CS-Unspec   LIV  1B Term 2018    F CS-Unspec   LIV    Past Medical History:  Diagnosis Date  . Amenorrhea   . Gallbladder abscess   . History of ovarian cyst 12/07/2015  . Sleep apnea     Past Surgical History:  Procedure Laterality Date  . CESAREAN SECTION    . GALLBLADDER SURGERY    . MANDIBLE FRACTURE SURGERY    . TONSILLECTOMY    . WISDOM TOOTH EXTRACTION      Family History  Problem Relation Age of Onset  . Hyperlipidemia Mother   . Hyperlipidemia Father   . Cancer (Lung) Maternal Grandmother     Social History   Socioeconomic History  . Marital status: Married    Spouse name: Not on file  . Number of children: Not on file  . Years of education: Not on file  . Highest education level: Not on file  Occupational History  . Not on file  Tobacco Use  .  Smoking status: Never Smoker  . Smokeless tobacco: Former 12/09/2015 and Sexual Activity  . Alcohol use: Yes    Comment: social  . Drug use: No  . Sexual activity: Yes    Partners: Male    Birth control/protection: I.U.D.  Other Topics Concern  . Not on file  Social History Narrative  . Not on file   Social Determinants of Health   Financial Resource Strain:   . Difficulty of Paying Living Expenses: Not on file  Food Insecurity:   . Worried About Engineer, water in the Last Year: Not on file  . Ran Out of Food in the Last Year: Not on file  Transportation Needs:   . Lack of Transportation (Medical): Not on file  . Lack of Transportation (Non-Medical): Not on file  Physical Activity:   . Days of Exercise per Week: Not on file  . Minutes of Exercise per Session: Not on file  Stress:   . Feeling of Stress : Not on file  Social Connections:   . Frequency of Communication with Friends and Family: Not on file  . Frequency of Social Gatherings with Friends and Family: Not on file  . Attends Religious Services: Not on file  .  Active Member of Clubs or Organizations: Not on file  . Attends Banker Meetings: Not on file  . Marital Status: Not on file  Intimate Partner Violence:   . Fear of Current or Ex-Partner: Not on file  . Emotionally Abused: Not on file  . Physically Abused: Not on file  . Sexually Abused: Not on file    Current Outpatient Medications on File Prior to Visit  Medication Sig Dispense Refill  . phenazopyridine (PYRIDIUM) 95 MG tablet Take 95 mg by mouth 3 (three) times daily as needed for pain.    . Levonorgestrel (KYLEENA) 19.5 MG IUD by Intrauterine route.     No current facility-administered medications on file prior to visit.    No Known Allergies   Review of Systems Constitutional: negative for chills, fatigue, fevers and sweats Eyes: negative for irritation, redness and visual disturbance Ears, nose, mouth, throat, and  face: negative for hearing loss, nasal congestion, snoring and tinnitus Respiratory: negative for asthma, cough, sputum Cardiovascular: negative for chest pain, dyspnea, exertional chest pressure/discomfort, irregular heart beat, palpitations and syncope Gastrointestinal: negative for abdominal pain, change in bowel habits, nausea and vomiting Genitourinary: negative for abnormal menstrual periods, genital lesions, sexual problems and vaginal discharge, dysuria and urinary incontinence Integument/breast: negative for breast lump, breast tenderness and nipple discharge Hematologic/lymphatic: negative for bleeding and easy bruising Musculoskeletal:negative for back pain and muscle weakness Neurological: negative for dizziness, headaches, vertigo and weakness Endocrine: negative for diabetic symptoms including polydipsia, polyuria and skin dryness Allergic/Immunologic: negative for hay fever and urticaria      Objective:  Blood pressure 109/66, pulse 90, height 5\' 6"  (1.676 m), weight 182 lb (82.6 kg). Body mass index is 29.38 kg/m.   General Appearance:    Alert, cooperative, no distress, appears stated age, overweight   Head:    Normocephalic, without obvious abnormality, atraumatic  Eyes:    PERRL, conjunctiva/corneas clear, EOM's intact, both eyes  Ears:    Normal external ear canals, both ears  Nose:   Nares normal, septum midline, mucosa normal, no drainage or sinus tenderness  Throat:   Lips, mucosa, and tongue normal; teeth and gums normal  Neck:   Supple, symmetrical, trachea midline, no adenopathy; thyroid: no enlargement/tenderness/nodules; no carotid bruit or JVD  Back:     Symmetric, no curvature, ROM normal, no CVA tenderness  Lungs:     Clear to auscultation bilaterally, respirations unlabored  Chest Wall:    No tenderness or deformity   Heart:    Regular rate and rhythm, S1 and S2 normal, no murmur, rub or gallop  Breast Exam:    No tenderness, masses, inverted nipples  bilaterally.  Sparse long dark hair around left nipple, slightly more on right nipple.   Abdomen:     Soft, non-tender, bowel sounds active all four quadrants, no masses, no organomegaly.    Genitalia:    Pelvic:external genitalia normal, vagina without lesions, discharge, or tenderness, rectovaginal septum  normal. Cervix normal in appearance, no cervical motion tenderness, no adnexal masses or tenderness.  Uterus normal size, shape, mobile, regular contours, nontender.  Rectal:    Normal external sphincter.  No hemorrhoids appreciated. Internal exam not done.   Extremities:   Extremities normal, atraumatic, no cyanosis or edema  Pulses:   2+ and symmetric all extremities  Skin:   Skin color, texture, turgor normal, no rashes or lesions.  Scant thin hair along upper lip.    Lymph nodes:   Cervical, supraclavicular, and axillary nodes  normal  Neurologic:   CNII-XII intact, normal strength, sensation and reflexes throughout   .  Labs:  Lab Results  Component Value Date   WBC 12.0 (H) 08/17/2016   HGB 11.8 (L) 08/17/2016   HCT 34.2 (L) 08/17/2016   MCV 89.0 08/17/2016   PLT 202 08/17/2016    Lab Results  Component Value Date   CREATININE 0.54 08/17/2016   BUN 7 08/17/2016   NA 138 08/17/2016   K 3.7 08/17/2016   CL 105 08/17/2016   CO2 25 08/17/2016    Results for orders placed or performed in visit on 06/18/19  POCT urinalysis dipstick  Result Value Ref Range   Color, UA yellow    Clarity, UA clear    Glucose, UA Negative Negative   Bilirubin, UA neg    Ketones, UA neg    Spec Grav, UA >=1.030 (A) 1.010 - 1.025   Blood, UA hem trace    pH, UA 6.0 5.0 - 8.0   Protein, UA Negative Negative   Urobilinogen, UA 0.2 0.2 or 1.0 E.U./dL   Nitrite, UA neg    Leukocytes, UA Negative Negative   Appearance yellow;clear    Odor      Assessment:    Healthy female exam.   Cervical cancer screening  IUD in place  Overweight UTI symptoms History of depression  Plan:     Blood tests: Basic metabolic panel, CBC with diff, TSH.  Breast self exam technique reviewed and patient encouraged to perform self-exam monthly. Contraception: Kyleena IUD, inserted 01/2019. Doing well.  Discussed healthy lifestyle modifications. Pap smear performed today.   UTI symptoms, UA performed however in light of patient recently taking Azo may give false readings. Will order urine culture. Will treat based on symptoms with Bactrim DS.  Patient desires referral to a counselor. Has a history of depressive symptoms in the past but overall notes feeling pretty good. Just feels she may benefit from counseling.  Info given. Can place referral if needed. Declines flu vaccine.  Previously discussed Gardasil vaccine, declined (but did have 1 dose by her Pediatrician when she was younger).  RTC in 1 year for annual exam.   Rubie Maid, MD [facility]

## 2019-06-18 NOTE — Patient Instructions (Addendum)
Preventive Care 21-26 Years Old, Female Preventive care refers to visits with your health care provider and lifestyle choices that can promote health and wellness. This includes:  A yearly physical exam. This may also be called an annual well check.  Regular dental visits and eye exams.  Immunizations.  Screening for certain conditions.  Healthy lifestyle choices, such as eating a healthy diet, getting regular exercise, not using drugs or products that contain nicotine and tobacco, and limiting alcohol use. What can I expect for my preventive care visit? Physical exam Your health care provider will check your:  Height and weight. This may be used to calculate body mass index (BMI), which tells if you are at a healthy weight.  Heart rate and blood pressure.  Skin for abnormal spots. Counseling Your health care provider may ask you questions about your:  Alcohol, tobacco, and drug use.  Emotional well-being.  Home and relationship well-being.  Sexual activity.  Eating habits.  Work and work environment.  Method of birth control.  Menstrual cycle.  Pregnancy history. What immunizations do I need?  Influenza (flu) vaccine  This is recommended every year. Tetanus, diphtheria, and pertussis (Tdap) vaccine  You may need a Td booster every 10 years. Varicella (chickenpox) vaccine  You may need this if you have not been vaccinated. Human papillomavirus (HPV) vaccine  If recommended by your health care provider, you may need three doses over 6 months. Measles, mumps, and rubella (MMR) vaccine  You may need at least one dose of MMR. You may also need a second dose. Meningococcal conjugate (MenACWY) vaccine  One dose is recommended if you are age 19-21 years and a first-year college student living in a residence hall, or if you have one of several medical conditions. You may also need additional booster doses. Pneumococcal conjugate (PCV13) vaccine  You may need  this if you have certain conditions and were not previously vaccinated. Pneumococcal polysaccharide (PPSV23) vaccine  You may need one or two doses if you smoke cigarettes or if you have certain conditions. Hepatitis A vaccine  You may need this if you have certain conditions or if you travel or work in places where you may be exposed to hepatitis A. Hepatitis B vaccine  You may need this if you have certain conditions or if you travel or work in places where you may be exposed to hepatitis B. Haemophilus influenzae type b (Hib) vaccine  You may need this if you have certain conditions. You may receive vaccines as individual doses or as more than one vaccine together in one shot (combination vaccines). Talk with your health care provider about the risks and benefits of combination vaccines. What tests do I need?  Blood tests  Lipid and cholesterol levels. These may be checked every 5 years starting at age 20.  Hepatitis C test.  Hepatitis B test. Screening  Diabetes screening. This is done by checking your blood sugar (glucose) after you have not eaten for a while (fasting).  Sexually transmitted disease (STD) testing.  BRCA-related cancer screening. This may be done if you have a family history of breast, ovarian, tubal, or peritoneal cancers.  Pelvic exam and Pap test. This may be done every 3 years starting at age 21. Starting at age 30, this may be done every 5 years if you have a Pap test in combination with an HPV test. Talk with your health care provider about your test results, treatment options, and if necessary, the need for more tests.   Follow these instructions at home: Eating and drinking   Eat a diet that includes fresh fruits and vegetables, whole grains, lean protein, and low-fat dairy.  Take vitamin and mineral supplements as recommended by your health care provider.  Do not drink alcohol if: ? Your health care provider tells you not to drink. ? You are  pregnant, may be pregnant, or are planning to become pregnant.  If you drink alcohol: ? Limit how much you have to 0-1 drink a day. ? Be aware of how much alcohol is in your drink. In the [place] one drink equals one 12 oz bottle of beer (355 mL), one 5 oz glass of wine (148 mL), or one 1 oz glass of hard liquor (44 mL). Lifestyle  Take daily care of your teeth and gums.  Stay active. Exercise for at least 30 minutes on 5 or more days each week.  Do not use any products that contain nicotine or tobacco, such as cigarettes, e-cigarettes, and chewing tobacco. If you need help quitting, ask your health care provider.  If you are sexually active, practice safe sex. Use a condom or other form of birth control (contraception) in order to prevent pregnancy and STIs (sexually transmitted infections). If you plan to become pregnant, see your health care provider for a preconception visit. What's next?  Visit your health care provider once a year for a well check visit.  Ask your health care provider how often you should have your eyes and teeth checked.  Stay up to date on all vaccines. This information is not intended to replace advice given to you by your health care provider. Make sure you discuss any questions you have with your health care provider. Document Revised: 12/20/2017 Document Reviewed: 12/20/2017 Elsevier Patient Education  2020 [employer] Breast Self-Awareness Breast self-awareness is knowing how your breasts look and feel. Doing breast self-awareness is important. It allows you to catch a breast problem early while it is still small and can be treated. All women should do breast self-awareness, including women who have had breast implants. Tell your doctor if you notice a change in your breasts. What you need:  A mirror.  A well-lit room. How to do a breast self-exam A breast self-exam is one way to learn what is normal for your breasts and to check for changes. To do a  breast self-exam: Look for changes  1. Take off all the clothes above your waist. 2. Stand in front of a mirror in a room with good lighting. 3. Put your hands on your hips. 4. Push your hands down. 5. Look at your breasts and nipples in the mirror to see if one breast or nipple looks different from the other. Check to see if: ? The shape of one breast is different. ? The size of one breast is different. ? There are wrinkles, dips, and bumps in one breast and not the other. 6. Look at each breast for changes in the skin, such as: ? Redness. ? Scaly areas. 7. Look for changes in your nipples, such as: ? Liquid around the nipples. ? Bleeding. ? Dimpling. ? Redness. ? A change in where the nipples are. Feel for changes  1. Lie on your back on the floor. 2. Feel each breast. To do this, follow these steps: ? Pick a breast to feel. ? Put the arm closest to that breast above your head. ? Use your other arm to feel the nipple area of your breast. Feel   the area with the pads of your three middle fingers by making small circles with your fingers. For the first circle, press lightly. For the second circle, press harder. For the third circle, press even harder. ? Keep making circles with your fingers at the different pressures as you move down your breast. Stop when you feel your ribs. ? Move your fingers a little toward the center of your body. ? Start making circles with your fingers again, this time going up until you reach your collarbone. ? Keep making up-and-down circles until you reach your armpit. Remember to keep using the three pressures. ? Feel the other breast in the same way. 3. Sit or stand in the tub or shower. 4. With soapy water on your skin, feel each breast the same way you did in step 2 when you were lying on the floor. Write down what you find Writing down what you find can help you remember what to tell your doctor. Write down:  What is normal for each breast.  Any  changes you find in each breast, including: ? The kind of changes you find. ? Whether you have pain. ? Size and location of any lumps.  When you last had your menstrual period. General tips  Check your breasts every month.  If you are breastfeeding, the best time to check your breasts is after you feed your baby or after you use a breast pump.  If you get menstrual periods, the best time to check your breasts is 5-7 days after your menstrual period is over.  With time, you will become comfortable with the self-exam, and you will begin to know if there are changes in your breasts. Contact a doctor if you:  See a change in the shape or size of your breasts or nipples.  See a change in the skin of your breast or nipples, such as red or scaly skin.  Have fluid coming from your nipples that is not normal.  Find a lump or thick area that was not there before.  Have pain in your breasts.  Have any concerns about your breast health. Summary  Breast self-awareness includes looking for changes in your breasts, as well as feeling for changes within your breasts.  Breast self-awareness should be done in front of a mirror in a well-lit room.  You should check your breasts every month. If you get menstrual periods, the best time to check your breasts is 5-7 days after your menstrual period is over.  Let your doctor know of any changes you see in your breasts, including changes in size, changes on the skin, pain or tenderness, or fluid from your nipples that is not normal. This information is not intended to replace advice given to you by your health care provider. Make sure you discuss any questions you have with your health care provider. Document Revised: 11/27/2017 Document Reviewed: 11/27/2017 Elsevier Patient Education  [place].     Urinary Tract Infection, Adult A urinary tract infection (UTI) is an infection of any part of the urinary tract. The urinary tract  includes:  The kidneys.  The ureters.  The bladder.  The urethra. These organs make, store, and get rid of pee (urine) in the body. What are the causes? This is caused by germs (bacteria) in your genital area. These germs grow and cause swelling (inflammation) of your urinary tract. What increases the risk? You are more likely to develop this condition if:  You have a small,  thin tube (catheter) to drain pee.  You cannot control when you pee or poop (incontinence).  You are female, and: ? You use these methods to prevent pregnancy:  A medicine that kills sperm (spermicide).  A device that blocks sperm (diaphragm). ? You have low levels of a female hormone (estrogen). ? You are pregnant.  You have genes that add to your risk.  You are sexually active.  You take antibiotic medicines.  You have trouble peeing because of: ? A prostate that is bigger than normal, if you are female. ? A blockage in the part of your body that drains pee from the bladder (urethra). ? A kidney stone. ? A nerve condition that affects your bladder (neurogenic bladder). ? Not getting enough to drink. ? Not peeing often enough.  You have other conditions, such as: ? Diabetes. ? A weak disease-fighting system (immune system). ? Sickle cell disease. ? Gout. ? Injury of the spine. What are the signs or symptoms? Symptoms of this condition include:  Needing to pee right away (urgently).  Peeing often.  Peeing small amounts often.  Pain or burning when peeing.  Blood in the pee.  Pee that smells bad or not like normal.  Trouble peeing.  Pee that is cloudy.  Fluid coming from the vagina, if you are female.  Pain in the belly or lower back. Other symptoms include:  Throwing up (vomiting).  No urge to eat.  Feeling mixed up (confused).  Being tired and grouchy (irritable).  A fever.  Watery poop (diarrhea). How is this treated? This condition may be treated  with:  Antibiotic medicine.  Other medicines.  Drinking enough water. Follow these instructions at home:  Medicines  Take over-the-counter and prescription medicines only as told by your doctor.  If you were prescribed an antibiotic medicine, take it as told by your doctor. Do not stop taking it even if you start to feel better. General instructions  Make sure you: ? Pee until your bladder is empty. ? Do not hold pee for a long time. ? Empty your bladder after sex. ? Wipe from front to back after pooping if you are a female. Use each tissue one time when you wipe.  Drink enough fluid to keep your pee pale yellow.  Keep all follow-up visits as told by your doctor. This is important. Contact a doctor if:  You do not get better after 1-2 days.  Your symptoms go away and then come back. Get help right away if:  You have very bad back pain.  You have very bad pain in your lower belly.  You have a fever.  You are sick to your stomach (nauseous).  You are throwing up. Summary  A urinary tract infection (UTI) is an infection of any part of the urinary tract.  This condition is caused by germs in your genital area.  There are many risk factors for a UTI. These include having a small, thin tube to drain pee and not being able to control when you pee or poop.  Treatment includes antibiotic medicines for germs.  Drink enough fluid to keep your pee pale yellow. This information is not intended to replace advice given to you by your health care provider. Make sure you discuss any questions you have with your health care provider. Document Revised: 03/28/2018 Document Reviewed: 10/18/2017 Elsevier Patient Education  2020 [employer].

## 2019-06-19 LAB — CBC
Hematocrit: 35.3 % (ref 34.0–46.6)
Hemoglobin: 11.6 g/dL (ref 11.1–15.9)
MCH: 29.1 pg (ref 26.6–33.0)
MCHC: 32.9 g/dL (ref 31.5–35.7)
MCV: 89 fL (ref 79–97)
Platelets: 220 10*3/uL (ref 150–450)
RBC: 3.98 x10E6/uL (ref 3.77–5.28)
RDW: 11.6 % — ABNORMAL LOW (ref 11.7–15.4)
WBC: 6.6 10*3/uL (ref 3.4–10.8)

## 2019-06-19 LAB — COMPREHENSIVE METABOLIC PANEL
ALT: 14 IU/L (ref 0–32)
AST: 10 IU/L (ref 0–40)
Albumin/Globulin Ratio: 2.1 (ref 1.2–2.2)
Albumin: 4.2 g/dL (ref 3.9–5.0)
Alkaline Phosphatase: 104 IU/L (ref 39–117)
BUN/Creatinine Ratio: 20 (ref 9–23)
BUN: 13 mg/dL (ref 6–20)
Bilirubin Total: 0.4 mg/dL (ref 0.0–1.2)
CO2: 23 mmol/L (ref 20–29)
Calcium: 9.2 mg/dL (ref 8.7–10.2)
Chloride: 104 mmol/L (ref 96–106)
Creatinine, Ser: 0.66 mg/dL (ref 0.57–1.00)
GFR calc Af Amer: 142 mL/min/{1.73_m2} (ref >59)
GFR calc non Af Amer: 123 mL/min/{1.73_m2} (ref >59)
Globulin, Total: 2 g/dL (ref 1.5–4.5)
Glucose: 65 mg/dL (ref 65–99)
Potassium: 4.1 mmol/L (ref 3.5–5.2)
Sodium: 140 mmol/L (ref 134–144)
Total Protein: 6.2 g/dL (ref 6.0–8.5)

## 2019-06-19 LAB — CYTOLOGY - PAP: Diagnosis: NEGATIVE

## 2019-06-19 LAB — TSH: TSH: 0.894 u[IU]/mL (ref 0.450–4.500)

## 2019-06-20 LAB — URINE CULTURE: Organism ID, Bacteria: NO GROWTH

## 2019-06-23 NOTE — Telephone Encounter (Signed)
Called pt to speak to her to see how she was doing since her visit to the office concerning her UTI. Pt stated that she is no longer having UTI symptoms but a sharp pain in lower back. Pt stated that if the symptoms increase that she would call the office to schedule an appointment to be seen by [facility].

## 2019-10-24 ENCOUNTER — Encounter: Payer: Self-pay | Admitting: Obstetrics and Gynecology

## 2019-10-24 ENCOUNTER — Other Ambulatory Visit: Payer: Self-pay

## 2019-10-24 ENCOUNTER — Ambulatory Visit (INDEPENDENT_AMBULATORY_CARE_PROVIDER_SITE_OTHER): Payer: Commercial Managed Care - PPO | Admitting: Obstetrics and Gynecology

## 2019-10-24 VITALS — BP 127/79 | HR 83 | Ht 66.0 in | Wt 177.4 lb

## 2019-10-24 DIAGNOSIS — Z30432 Encounter for removal of intrauterine contraceptive device: Secondary | ICD-10-CM | POA: Diagnosis not present

## 2019-10-24 NOTE — Progress Notes (Signed)
Pt is present to have her IUD removed. Pt stated that the reason for the removal of the IUD today to free her body of the hormones due to planning on conceiving.

## 2019-10-24 NOTE — Patient Instructions (Signed)
Preparing for Pregnancy °If you are considering becoming pregnant, make an appointment to see your regular health care provider to learn how to prepare for a safe and healthy pregnancy (preconception care). During a preconception care visit, your health care provider will: °· Do a complete physical exam, including a Pap test. °· Take a complete medical history. °· Give you information, answer your questions, and help you resolve problems. °Preconception checklist °Medical history °· Tell your health care provider about any current or past medical conditions. Your pregnancy or your ability to become pregnant may be affected by chronic conditions, such as diabetes, chronic hypertension, and thyroid problems. °· Include your family's medical history as well as your partner's medical history. °· Tell your health care provider about any history of STIs (sexually transmitted infections). These can affect your pregnancy. In some cases, they can be passed to your baby. Discuss any concerns that you have about STIs. °· If indicated, discuss the benefits of genetic testing. This testing will show whether there are any genetic conditions that may be passed from you or your partner to your baby. °· Tell your health care provider about: °? Any problems you have had with conception or pregnancy. °? Any medicines you take. These include vitamins, herbal supplements, and over-the-counter medicines. °? Your history of immunizations. Discuss any vaccinations that you may need. °Diet °· Ask your health care provider what to include in a healthy diet that has a balance of nutrients. This is especially important when you are pregnant or preparing to become pregnant. °· Ask your health care provider to help you reach a healthy weight before pregnancy. °? If you are overweight, you may be at higher risk for certain complications, such as high blood pressure, diabetes, and preterm birth. °? If you are underweight, you are more likely to  have a baby who has a low birth weight. °Lifestyle, work, and home °· Let your health care provider know: °? About any lifestyle habits that you have, such as alcohol use, drug use, or smoking. °? About recreational activities that may put you at risk during pregnancy, such as downhill skiing and certain exercise programs. °? Tell your health care provider about any international travel, especially any travel to places with an active Zika virus outbreak. °? About harmful substances that you may be exposed to at work or at home. These include chemicals, pesticides, radiation, or even litter boxes. °? If you do not feel safe at home. °Mental health °· Tell your health care provider about: °? Any history of mental health conditions, including feelings of depression, sadness, or anxiety. °? Any medicines that you take for a mental health condition. These include herbs and supplements. °Home instructions to prepare for pregnancy °Lifestyle ° °· Eat a balanced diet. This includes fresh fruits and vegetables, whole grains, lean meats, low-fat dairy products, healthy fats, and foods that are high in fiber. Ask to meet with a nutritionist or registered dietitian for assistance with meal planning and goals. °· Get regular exercise. Try to be active for at least 30 minutes a day on most days of the week. Ask your health care provider which activities are safe during pregnancy. °· Do not use any products that contain nicotine or tobacco, such as cigarettes and e-cigarettes. If you need help quitting, ask your health care provider. °· Do not drink alcohol. °· Do not take illegal drugs. °· Maintain a healthy weight. Ask your health care provider what weight range is right for you. °General   instructions °· Keep an accurate record of your menstrual periods. This makes it easier for your health care provider to determine your baby's due date. °· Begin taking prenatal vitamins and folic acid supplements daily as directed by your  health care provider. °· Manage any chronic conditions, such as high blood pressure and diabetes, as told by your health care provider. This is important. °How do I know that I am pregnant? °You may be pregnant if you have been sexually active and you miss your period. Symptoms of early pregnancy include: °· Mild cramping. °· Very light vaginal bleeding (spotting). °· Feeling unusually tired. °· Nausea and vomiting (morning sickness). °If you have any of these symptoms and you suspect that you might be pregnant, you can take a home pregnancy test. These tests check for a hormone in your urine (human chorionic gonadotropin, or hCG). A woman's body begins to make this hormone during early pregnancy. These tests are very accurate. Wait until at least the first day after you miss your period to take one. If the test shows that you are pregnant (you get a positive result), call your health care provider to make an appointment for prenatal care. °What should I do if I become pregnant? ° °  ° °· Make an appointment with your health care provider as soon as you suspect you are pregnant. °· Do not use any products that contain nicotine, such as cigarettes, chewing tobacco, and e-cigarettes. If you need help quitting, ask your health care provider. °· Do not drink alcoholic beverages. Alcohol is related to a number of birth defects. °· Avoid toxic odors and chemicals. °· You may continue to have sexual intercourse if it does not cause pain or other problems, such as vaginal bleeding. °This information is not intended to replace advice given to you by your health care provider. Make sure you discuss any questions you have with your health care provider. °Document Revised: 04/12/2017 Document Reviewed: 10/31/2015 °Elsevier Patient Education © 2020 [employer] ° °

## 2019-10-24 NOTE — Progress Notes (Signed)
    GYNECOLOGY OFFICE PROCEDURE NOTE  Kari Conner is a 24 y.o. [phone] here for [facility] IUD removal. No GYN concerns.  Last pap smear was on 06/18/2019 and was normal.  IUD Removal  Patient identified, informed consent performed, consent signed.  Patient was in the dorsal lithotomy position, normal external genitalia was noted.  A speculum was placed in the patient's vagina, normal discharge was noted, no lesions. The cervix was visualized, no lesions, no abnormal discharge.  The strings of the IUD were grasped and pulled using ring forceps. The IUD was removed in its entirety. Patient tolerated the procedure well.    Patient plans for pregnancy soon and she was told to avoid teratogens, take PNV and folic acid.  Routine preventative health maintenance measures emphasized.    Hildred Laser, MD [facility]

## 2019-11-25 ENCOUNTER — Encounter: Payer: Commercial Managed Care - PPO | Admitting: Obstetrics and Gynecology

## 2019-11-25 ENCOUNTER — Encounter: Payer: Self-pay | Admitting: Obstetrics and Gynecology

## 2019-11-25 ENCOUNTER — Ambulatory Visit (INDEPENDENT_AMBULATORY_CARE_PROVIDER_SITE_OTHER): Payer: Commercial Managed Care - PPO | Admitting: Obstetrics and Gynecology

## 2019-11-25 VITALS — BP 118/71 | HR 78 | Ht 66.0 in | Wt 174.0 lb

## 2019-11-25 DIAGNOSIS — R399 Unspecified symptoms and signs involving the genitourinary system: Secondary | ICD-10-CM | POA: Diagnosis not present

## 2019-11-25 LAB — POCT URINALYSIS DIPSTICK
Bilirubin, UA: NEGATIVE
Blood, UA: NEGATIVE
Glucose, UA: NEGATIVE
Ketones, UA: NEGATIVE
Leukocytes, UA: NEGATIVE
Nitrite, UA: NEGATIVE
Protein, UA: NEGATIVE
Spec Grav, UA: 1.025 (ref 1.010–1.025)
Urobilinogen, UA: 0.2 E.U./dL
pH, UA: 6 (ref 5.0–8.0)

## 2019-11-25 NOTE — Progress Notes (Signed)
    Subjective:    Kari Conner is a 24 y.o.  female who complains of frequency, incontinence and pain in the RLQ for 3 days.  Patient also complains of back pain. Patient denies fever and vaginal discharge.  This makes second UTI in 3 months.  Patient does not have a history of pyelonephritis.  Notes that she began taking the leftover antibiotic from her previous infection (reported only taking 2 days worth last time as her symptoms improved) and Azo yesterday.   Also notes an increase in acne since removal of her IUD.  The following portions of the patient's history were reviewed and updated as appropriate: allergies, current medications, past family history, past medical history, past social history, past surgical history and problem list. Review of Systems Pertinent items noted in HPI and remainder of comprehensive ROS otherwise negative.    Objective:    BP 118/71   Pulse 78   Ht 5\' 6"  (1.676 m)   Wt 174 lb (78.9 kg)   BMI 28.08 kg/m  General: alert and no distress  Abdomen: soft, non-tender, without masses or organomegaly   Back: CVA tenderness absent  GU: defer exam   Laboratory:  Results for orders placed or performed in visit on 11/25/19  POCT urinalysis dipstick  Result Value Ref Range   Color, UA yellow    Clarity, UA clear    Glucose, UA Negative Negative   Bilirubin, UA neg    Ketones, UA neg    Spec Grav, UA 1.025 1.010 - 1.025   Blood, UA neg    pH, UA 6.0 5.0 - 8.0   Protein, UA Negative Negative   Urobilinogen, UA 0.2 0.2 or 1.0 E.U./dL   Nitrite, UA neg    Leukocytes, UA Negative Negative   Appearance yellow;clear    Odor       Assessment:    Acute cystitis    Plan:    1. Medications: patient has already reinitiated remaining antibiotics from previous UTI. Advised to complete course. 2. Maintain adequate hydration, continue Azo.  3. Follow up if symptoms not improving, and prn.     01/25/20, MD [facility]

## 2019-11-25 NOTE — Progress Notes (Signed)
Pt present for UTI symptoms. Pt c/o lower back pain, freq urination, burning with urination, and abd pain x 3 days. Pt took AZO yesterday for the UTI symptoms.

## 2019-11-25 NOTE — Patient Instructions (Signed)

## 2020-01-05 ENCOUNTER — Other Ambulatory Visit: Payer: Self-pay

## 2020-01-05 MED ORDER — FLUCONAZOLE 150 MG PO TABS: 150.0000 mg | ORAL_TABLET | Freq: Once | ORAL | 1 refills | Status: AC

## 2020-06-22 ENCOUNTER — Ambulatory Visit (INDEPENDENT_AMBULATORY_CARE_PROVIDER_SITE_OTHER): Payer: BC Managed Care – PPO | Admitting: Obstetrics and Gynecology

## 2020-06-22 ENCOUNTER — Other Ambulatory Visit: Payer: Self-pay

## 2020-06-22 ENCOUNTER — Encounter: Payer: Self-pay | Admitting: Obstetrics and Gynecology

## 2020-06-22 VITALS — BP 109/75 | HR 75 | Ht 66.0 in | Wt 186.9 lb

## 2020-06-22 DIAGNOSIS — Z8616 Personal history of COVID-19: Secondary | ICD-10-CM | POA: Diagnosis not present

## 2020-06-22 DIAGNOSIS — Z01419 Encounter for gynecological examination (general) (routine) without abnormal findings: Secondary | ICD-10-CM | POA: Diagnosis not present

## 2020-06-22 DIAGNOSIS — R399 Unspecified symptoms and signs involving the genitourinary system: Secondary | ICD-10-CM

## 2020-06-22 DIAGNOSIS — Z975 Presence of (intrauterine) contraceptive device: Secondary | ICD-10-CM | POA: Diagnosis not present

## 2020-06-22 DIAGNOSIS — N926 Irregular menstruation, unspecified: Secondary | ICD-10-CM

## 2020-06-22 DIAGNOSIS — E669 Obesity, unspecified: Secondary | ICD-10-CM

## 2020-06-22 LAB — POCT URINALYSIS DIPSTICK
Bilirubin, UA: NEGATIVE
Glucose, UA: NEGATIVE
Ketones, UA: NEGATIVE
Leukocytes, UA: NEGATIVE
Nitrite, UA: NEGATIVE
Protein, UA: NEGATIVE
Spec Grav, UA: 1.015 (ref 1.010–1.025)
Urobilinogen, UA: 0.2 E.U./dL
pH, UA: 7 (ref 5.0–8.0)

## 2020-06-22 NOTE — Progress Notes (Signed)
GYNECOLOGY ANNUAL PHYSICAL EXAM PROGRESS NOTE  Subjective:    Kari Conner is a 25 y.o. married [id number] female who presents for an annual exam.  The patient is sexually active. The patient wears seatbelts: yes. The patient participates in regular exercise: no. Has the patient ever been transfused or tattooed?: no  The patient has the following complaints today: 1.  Residual UTI symptoms -and reports that she was treated for UTI approximately 1 week ago with antibiotics.  She is still noting some mild irritative voiding symptoms.  Wonders if she was adequately treated or needs to be retreated.  Would like urine checked today. 2.  And also reports that her most recent cycle was different, mostly noting old brown blood.  Her bleeding did increase in length from usual 2 to 3 days now 4 to 5 days on her most recent cycle. 3.  She reports a recent bout with COVID-19 infection in January.  Notes that she was symptomatic however was able to manage her symptoms at home.  Did not require any hospitalization. 4.  Patient also notes a small lump under her right armpit.  It is not painful, non-tender.  5.  Patient reports that she had some labs performed last week due to symptoms of chest pain.  She notes that she spoke with her PCP regarding her most recent labs and felt like her concerns were "brushed off" as her provider told her there was nothing to worry about.  Requests second opinion of review of labs.   Gynecologic History Menarche age: 71 Patient's last menstrual period was 06/16/2020. Contraception: none History of STI's: Denies  Last Pap: 06/18/2019. Results were: normal.    OB History  Gravida Para Term Preterm AB Living  1 1 1  0 0 2  SAB IAB Ectopic Multiple Live Births  0 0 0 1 2    # Outcome Date GA Lbr Len/2nd Weight Sex Delivery Anes PTL Lv  1A Term 2018    F CS-Unspec   LIV  1B Term 2018    F CS-Unspec   LIV    Past Medical History:  Diagnosis Date  . Amenorrhea   .  Anxiety   . Depression   . Gallbladder abscess   . History of ovarian cyst 12/07/2015  . Sleep apnea     Past Surgical History:  Procedure Laterality Date  . CESAREAN SECTION    . GALLBLADDER SURGERY    . MANDIBLE FRACTURE SURGERY    . TONSILLECTOMY    . WISDOM TOOTH EXTRACTION      Family History  Problem Relation Age of Onset  . Hypertension Mother   . Hypertension Father   . Cancer Maternal Grandmother        lung    Social History   Socioeconomic History  . Marital status: Married    Spouse name: Not on file  . Number of children: Not on file  . Years of education: Not on file  . Highest education level: Not on file  Occupational History  . Not on file  Tobacco Use  . Smoking status: Never Smoker  . Smokeless tobacco: Former 12/09/2015  . Vaping Use: Never used  Substance and Sexual Activity  . Alcohol use: Yes    Comment: social  . Drug use: No  . Sexual activity: Yes    Partners: Male    Birth control/protection: None  Other Topics Concern  . Not on file  Social History Narrative  .  Not on file   Social Determinants of Health   Financial Resource Strain: Not on file  Food Insecurity: Not on file  Transportation Needs: Not on file  Physical Activity: Not on file  Stress: Not on file  Social Connections: Not on file  Intimate Partner Violence: Not on file    Current Outpatient Medications on File Prior to Visit  Medication Sig Dispense Refill  . buPROPion (WELLBUTRIN SR) 100 MG 12 hr tablet Take 100 mg by mouth 2 (two) times daily.    . Multiple Vitamins-Minerals (MULTIPLE VITAMINS/WOMENS PO) Take by mouth.     No current facility-administered medications on file prior to visit.    Allergies  Allergen Reactions  . Latex      Review of Systems Constitutional: negative for chills, fatigue, fevers and sweats Eyes: negative for irritation, redness and visual disturbance Ears, nose, mouth, throat, and face: negative for hearing loss,  nasal congestion, snoring and tinnitus Respiratory: negative for asthma, cough, sputum Cardiovascular: negative for chest pain, dyspnea, exertional chest pressure/discomfort, irregular heart beat, palpitations and syncope Gastrointestinal: negative for abdominal pain, change in bowel habits, nausea and vomiting Genitourinary: positive for irregular menstrual period, residual UTI symptoms (see HPI).  Negative for genital lesions, sexual problems and vaginal discharge, and urinary incontinence Integument/breast: negative for breast lump, breast tenderness and nipple discharge Hematologic/lymphatic: negative for bleeding and easy bruising Musculoskeletal:negative for back pain and muscle weakness Neurological: negative for dizziness, headaches, vertigo and weakness Endocrine: negative for diabetic symptoms including polydipsia, polyuria and skin dryness Allergic/Immunologic: negative for hay fever and urticaria      Objective:  Blood pressure 109/75, pulse 75, height 5\' 6"  (1.676 m), weight 186 lb 14.4 oz (84.8 kg), last menstrual period 06/16/2020. Body mass index is 30.17 kg/m.   General Appearance:    Alert, cooperative, no distress, appears stated age, mild obesity  Head:    Normocephalic, without obvious abnormality, atraumatic  Eyes:    PERRL, conjunctiva/corneas clear, EOM's intact, both eyes  Ears:    Normal external ear canals, both ears  Nose:   Nares normal, septum midline, mucosa normal, no drainage or sinus tenderness  Throat:   Lips, mucosa, and tongue normal; teeth and gums normal  Neck:   Supple, symmetrical, trachea midline, no adenopathy; thyroid: no enlargement/tenderness/nodules; no carotid bruit or JVD  Back:     Symmetric, no curvature, ROM normal, no CVA tenderness  Lungs:     Clear to auscultation bilaterally, respirations unlabored  Chest Wall:    No tenderness or deformity   Heart:    Regular rate and rhythm, S1 and S2 normal, no murmur, rub or gallop  Breast Exam:     No tenderness, masses, inverted nipples bilaterally.    Abdomen:     Soft, non-tender, bowel sounds active all four quadrants, no masses, no organomegaly.    Genitalia:    Pelvic:external genitalia normal, vagina without lesions, or tenderness, small amount of brown blood in vaginal vault. Rectovaginal septum  normal. Cervix normal in appearance, no cervical motion tenderness, no adnexal masses or tenderness. IUD threads visible, 3 cm in length. Uterus normal size, shape, mobile, regular contours, nontender.  Rectal:    Normal external sphincter.  No hemorrhoids appreciated. Internal exam not done.   Extremities:   Extremities normal, atraumatic, no cyanosis or edema  Pulses:   2+ and symmetric all extremities  Skin:   Skin color, texture, turgor normal, no rashes or lesions.  Small pinpoint papule beneath skin in right  axillary region.     Lymph nodes:   Cervical, supraclavicular, and axillary nodes normal  Neurologic:   CNII-XII intact, normal strength, sensation and reflexes throughout   .  Labs:  Labs reviewed in Care Everywhere  Results for orders placed or performed in visit on 06/22/20  POCT urinalysis dipstick  Result Value Ref Range   Color, UA yellow    Clarity, UA clear    Glucose, UA Negative Negative   Bilirubin, UA neg    Ketones, UA neg    Spec Grav, UA 1.015 1.010 - 1.025   Blood, UA small    pH, UA 7.0 5.0 - 8.0   Protein, UA Negative Negative   Urobilinogen, UA 0.2 0.2 or 1.0 E.U./dL   Nitrite, UA neg    Leukocytes, UA Negative Negative   Appearance yellow;clear    Odor      Assessment:   Healthy female exam. IUD in place  Obesity, Class I UTI symptoms History of COVID infection Abnormal menstrual cycle   Plan:    - Blood tests: Basic metabolic panel, CBC with diff, TSH.  - Breast self exam technique reviewed and patient encouraged to perform self-exam monthly. - Contraception: Kyleena IUD, inserted 01/2019. Doing well.  - Discussed healthy  lifestyle modifications. - Pap smear up to date.  - Residual UTI symptoms, UA performed, normal except for scant blood, however patient also with small amount of blood in vaginal vault, which may be source. Given reassurance.  - Reviewed patient's labs in Care Everywhere, noted a borderline decreased BUN and creatinine, as well as a mildly low WBC count.  Discussed possible reasons for lab abnormalities.  Attempted to offer reassurance again that overall these were of low significance to her overall health.  Offered to redraw again in another 3 months to ensure restitution to normal levels. - Given reassurance on small lump under right axillary region, is likely either a clogged hair follicle or nodule regular axillary tissue.  Is of no cause for concern. - Declines flu vaccine.  - Declines Covid vaccine.  RTC in 1 year for annual exam.   Hildred Laser, MD [facility]

## 2020-06-22 NOTE — Progress Notes (Signed)
Annual Exam-pt stated that she was having uti symptoms UA completed and lump on right armpit. Pt declined flu vaccine.

## 2020-06-22 NOTE — Patient Instructions (Addendum)
Preventive Care 21-27 Years Old, Female Preventive care refers to lifestyle choices and visits with your health care provider that can promote health and wellness. This includes:  A yearly physical exam. This is also called an annual wellness visit.  Regular dental and eye exams.  Immunizations.  Screening for certain conditions.  Healthy lifestyle choices, such as: ? Eating a healthy diet. ? Getting regular exercise. ? Not using drugs or products that contain nicotine and tobacco. ? Limiting alcohol use. What can I expect for my preventive care visit? Physical exam Your health care provider may check your:  Height and weight. These may be used to calculate your BMI (body mass index). BMI is a measurement that tells if you are at a healthy weight.  Heart rate and blood pressure.  Body temperature.  Skin for abnormal spots. Counseling Your health care provider may ask you questions about your:  Past medical problems.  Family's medical history.  Alcohol, tobacco, and drug use.  Emotional well-being.  Home life and relationship well-being.  Sexual activity.  Diet, exercise, and sleep habits.  Work and work environment.  Access to firearms.  Method of birth control.  Menstrual cycle.  Pregnancy history. What immunizations do I need? Vaccines are usually given at various ages, according to a schedule. Your health care provider will recommend vaccines for you based on your age, medical history, and lifestyle or other factors, such as travel or where you work.   What tests do I need? Blood tests  Lipid and cholesterol levels. These may be checked every 5 years starting at age 20.  Hepatitis C test.  Hepatitis B test. Screening  Diabetes screening. This is done by checking your blood sugar (glucose) after you have not eaten for a while (fasting).  STD (sexually transmitted disease) testing, if you are at risk.  BRCA-related cancer screening. This may be  done if you have a family history of breast, ovarian, tubal, or peritoneal cancers.  Pelvic exam and Pap test. This may be done every 3 years starting at age 21. Starting at age 30, this may be done every 5 years if you have a Pap test in combination with an HPV test. Talk with your health care provider about your test results, treatment options, and if necessary, the need for more tests.   Follow these instructions at home: Eating and drinking  Eat a healthy diet that includes fresh fruits and vegetables, whole grains, lean protein, and low-fat dairy products.  Take vitamin and mineral supplements as recommended by your health care provider.  Do not drink alcohol if: ? Your health care provider tells you not to drink. ? You are pregnant, may be pregnant, or are planning to become pregnant.  If you drink alcohol: ? Limit how much you have to 0-1 drink a day. ? Be aware of how much alcohol is in your drink. In the [place] one drink equals one 12 oz bottle of beer (355 mL), one 5 oz glass of wine (148 mL), or one 1 oz glass of hard liquor (44 mL).   Lifestyle  Take daily care of your teeth and gums. Brush your teeth every morning and night with fluoride toothpaste. Floss one time each day.  Stay active. Exercise for at least 30 minutes 5 or more days each week.  Do not use any products that contain nicotine or tobacco, such as cigarettes, e-cigarettes, and chewing tobacco. If you need help quitting, ask your health care provider.  Do not   use drugs.  If you are sexually active, practice safe sex. Use a condom or other form of protection to prevent STIs (sexually transmitted infections).  If you do not wish to become pregnant, use a form of birth control. If you plan to become pregnant, see your health care provider for a prepregnancy visit.  Find healthy ways to cope with stress, such as: ? Meditation, yoga, or listening to music. ? Journaling. ? Talking to a trusted  person. ? Spending time with friends and family. Safety  Always wear your seat belt while driving or riding in a vehicle.  Do not drive: ? If you have been drinking alcohol. Do not ride with someone who has been drinking. ? When you are tired or distracted. ? While texting.  Wear a helmet and other protective equipment during sports activities.  If you have firearms in your house, make sure you follow all gun safety procedures.  Seek help if you have been physically or sexually abused. What's next?  Go to your health care provider once a year for an annual wellness visit.  Ask your health care provider how often you should have your eyes and teeth checked.  Stay up to date on all vaccines. This information is not intended to replace advice given to you by your health care provider. Make sure you discuss any questions you have with your health care provider. Document Revised: 12/07/2019 Document Reviewed: 12/20/2017 Elsevier Patient Education  2021 [place] Breast self-awareness is knowing how your breasts look and feel. Doing breast self-awareness is important. It allows you to catch a breast problem early while it is still small and can be treated. All women should do breast self-awareness, including women who have had breast implants. Tell your doctor if you notice a change in your breasts. What you need:  A mirror.  A well-lit room. How to do a breast self-exam A breast self-exam is one way to learn what is normal for your breasts and to check for changes. To do a breast self-exam: Look for changes 1. Take off all the clothes above your waist. 2. Stand in front of a mirror in a room with good lighting. 3. Put your hands on your hips. 4. Push your hands down. 5. Look at your breasts and nipples in the mirror to see if one breast or nipple looks different from the other. Check to see if: ? The shape of one breast is different. ? The size of one  breast is different. ? There are wrinkles, dips, and bumps in one breast and not the other. 6. Look at each breast for changes in the skin, such as: ? Redness. ? Scaly areas. 7. Look for changes in your nipples, such as: ? Liquid around the nipples. ? Bleeding. ? Dimpling. ? Redness. ? A change in where the nipples are.   Feel for changes 1. Lie on your back on the floor. 2. Feel each breast. To do this, follow these steps: ? Pick a breast to feel. ? Put the arm closest to that breast above your head. ? Use your other arm to feel the nipple area of your breast. Feel the area with the pads of your three middle fingers by making small circles with your fingers. For the first circle, press lightly. For the second circle, press harder. For the third circle, press even harder. ? Keep making circles with your fingers at the different pressures as you move down your breast. Stop when  you feel your ribs. ? Move your fingers a little toward the center of your body. ? Start making circles with your fingers again, this time going up until you reach your collarbone. ? Keep making up-and-down circles until you reach your armpit. Remember to keep using the three pressures. ? Feel the other breast in the same way. 3. Sit or stand in the tub or shower. 4. With soapy water on your skin, feel each breast the same way you did in step 2 when you were lying on the floor.   Write down what you find Writing down what you find can help you remember what to tell your doctor. Write down:  What is normal for each breast.  Any changes you find in each breast, including: ? The kind of changes you find. ? Whether you have pain. ? Size and location of any lumps.  When you last had your menstrual period. General tips  Check your breasts every month.  If you are breastfeeding, the best time to check your breasts is after you feed your baby or after you use a breast pump.  If you get menstrual periods, the  best time to check your breasts is 5-7 days after your menstrual period is over.  With time, you will become comfortable with the self-exam, and you will begin to know if there are changes in your breasts. Contact a doctor if you:  See a change in the shape or size of your breasts or nipples.  See a change in the skin of your breast or nipples, such as red or scaly skin.  Have fluid coming from your nipples that is not normal.  Find a lump or thick area that was not there before.  Have pain in your breasts.  Have any concerns about your breast health. Summary  Breast self-awareness includes looking for changes in your breasts, as well as feeling for changes within your breasts.  Breast self-awareness should be done in front of a mirror in a well-lit room.  You should check your breasts every month. If you get menstrual periods, the best time to check your breasts is 5-7 days after your menstrual period is over.  Let your doctor know of any changes you see in your breasts, including changes in size, changes on the skin, pain or tenderness, or fluid from your nipples that is not normal. This information is not intended to replace advice given to you by your health care provider. Make sure you discuss any questions you have with your health care provider. Document Revised: 11/27/2017 Document Reviewed: 11/27/2017 Elsevier Patient Education  2021 [employer]  Preventive Care 21-27 Years Old, Female Preventive care refers to lifestyle choices and visits with your health care provider that can promote health and wellness. This includes:  A yearly physical exam. This is also called an annual wellness visit.  Regular dental and eye exams.  Immunizations.  Screening for certain conditions.  Healthy lifestyle choices, such as: ? Eating a healthy diet. ? Getting regular exercise. ? Not using drugs or products that contain nicotine and tobacco. ? Limiting alcohol use. What can I  expect for my preventive care visit? Physical exam Your health care provider may check your:  Height and weight. These may be used to calculate your BMI (body mass index). BMI is a measurement that tells if you are at a healthy weight.  Heart rate and blood pressure.  Body temperature.  Skin for abnormal spots. Counseling Your health care   provider may ask you questions about your:  Past medical problems.  Family's medical history.  Alcohol, tobacco, and drug use.  Emotional well-being.  Home life and relationship well-being.  Sexual activity.  Diet, exercise, and sleep habits.  Work and work environment.  Access to firearms.  Method of birth control.  Menstrual cycle.  Pregnancy history. What immunizations do I need? Vaccines are usually given at various ages, according to a schedule. Your health care provider will recommend vaccines for you based on your age, medical history, and lifestyle or other factors, such as travel or where you work.   What tests do I need? Blood tests  Lipid and cholesterol levels. These may be checked every 5 years starting at age 20.  Hepatitis C test.  Hepatitis B test. Screening  Diabetes screening. This is done by checking your blood sugar (glucose) after you have not eaten for a while (fasting).  STD (sexually transmitted disease) testing, if you are at risk.  BRCA-related cancer screening. This may be done if you have a family history of breast, ovarian, tubal, or peritoneal cancers.  Pelvic exam and Pap test. This may be done every 3 years starting at age 21. Starting at age 30, this may be done every 5 years if you have a Pap test in combination with an HPV test. Talk with your health care provider about your test results, treatment options, and if necessary, the need for more tests.   Follow these instructions at home: Eating and drinking  Eat a healthy diet that includes fresh fruits and vegetables, whole grains, lean  protein, and low-fat dairy products.  Take vitamin and mineral supplements as recommended by your health care provider.  Do not drink alcohol if: ? Your health care provider tells you not to drink. ? You are pregnant, may be pregnant, or are planning to become pregnant.  If you drink alcohol: ? Limit how much you have to 0-1 drink a day. ? Be aware of how much alcohol is in your drink. In the [place] one drink equals one 12 oz bottle of beer (355 mL), one 5 oz glass of wine (148 mL), or one 1 oz glass of hard liquor (44 mL).   Lifestyle  Take daily care of your teeth and gums. Brush your teeth every morning and night with fluoride toothpaste. Floss one time each day.  Stay active. Exercise for at least 30 minutes 5 or more days each week.  Do not use any products that contain nicotine or tobacco, such as cigarettes, e-cigarettes, and chewing tobacco. If you need help quitting, ask your health care provider.  Do not use drugs.  If you are sexually active, practice safe sex. Use a condom or other form of protection to prevent STIs (sexually transmitted infections).  If you do not wish to become pregnant, use a form of birth control. If you plan to become pregnant, see your health care provider for a prepregnancy visit.  Find healthy ways to cope with stress, such as: ? Meditation, yoga, or listening to music. ? Journaling. ? Talking to a trusted person. ? Spending time with friends and family. Safety  Always wear your seat belt while driving or riding in a vehicle.  Do not drive: ? If you have been drinking alcohol. Do not ride with someone who has been drinking. ? When you are tired or distracted. ? While texting.  Wear a helmet and other protective equipment during sports activities.  If you have firearms   in your house, make sure you follow all gun safety procedures.  Seek help if you have been physically or sexually abused. What's next?  Go to your health care provider  once a year for an annual wellness visit.  Ask your health care provider how often you should have your eyes and teeth checked.  Stay up to date on all vaccines. This information is not intended to replace advice given to you by your health care provider. Make sure you discuss any questions you have with your health care provider. Document Revised: 12/07/2019 Document Reviewed: 12/20/2017 Elsevier Patient Education  2021 [place] Breast self-awareness means being familiar with how your breasts look and feel. It involves checking your breasts regularly and reporting any changes to your health care provider. Practicing breast self-awareness is important. Sometimes changes may not be harmful (are benign), but sometimes a change in your breasts can be a sign of a serious medical problem. It is important to learn how to do this procedure correctly so that you can catch problems early, when treatment is more likely to be successful. All women should practice breast self-awareness, including women who have had breast implants. What you need:  A mirror.  A well-lit room. How to do a breast self-exam A breast self-exam is one way to learn what is normal for your breasts and whether your breasts are changing. To do a breast self-exam: Look for changes 1. Remove all the clothing above your waist. 2. Stand in front of a mirror in a room with good lighting. 3. Put your hands on your hips. 4. Push your hands firmly downward. 5. Compare your breasts in the mirror. Look for differences between them (asymmetry), such as: ? Differences in shape. ? Differences in size. ? Puckers, dips, and bumps in one breast and not the other. 6. Look at each breast for changes in the skin, such as: ? Redness. ? Scaly areas. 7. Look for changes in your nipples, such as: ? Discharge. ? Bleeding. ? Dimpling. ? Redness. ? A change in position.   Feel for changes Carefully feel your  breasts for lumps and changes. It is best to do this while lying on your back on the floor, and again while sitting or standing in the tub or shower with soapy water on your skin. Feel each breast in the following way: 1. Place the arm on the side of the breast you are examining above your head. 2. Feel your breast with the other hand. 3. Start in the nipple area and make -inch (2 cm) overlapping circles to feel your breast. Use the pads of your three middle fingers to do this. Apply light pressure, then medium pressure, then firm pressure. The light pressure will allow you to feel the tissue closest to the skin. The medium pressure will allow you to feel the tissue that is a little deeper. The firm pressure will allow you to feel the tissue close to the ribs. 4. Continue the overlapping circles, moving downward over the breast until you feel your ribs below your breast. 5. Move one finger-width toward the center of the body. Continue to use the -inch (2 cm) overlapping circles to feel your breast as you move slowly up toward your collarbone. 6. Continue the up-and-down exam using all three pressures until you reach your armpit.   Write down what you find Writing down what you find can help you remember what to discuss with your health  care provider. Write down:  What is normal for each breast.  Any changes that you find in each breast, including: ? The kind of changes you find. ? Any pain or tenderness. ? Size and location of any lumps.  Where you are in your menstrual cycle, if you are still menstruating. General tips and recommendations  Examine your breasts every month.  If you are breastfeeding, the best time to examine your breasts is after a feeding or after using a breast pump.  If you menstruate, the best time to examine your breasts is 5-7 days after your period. Breasts are generally lumpier during menstrual periods, and it may be more difficult to notice changes.  With time  and practice, you will become more familiar with the variations in your breasts and more comfortable with the exam. Contact a health care provider if you:  See a change in the shape or size of your breasts or nipples.  See a change in the skin of your breast or nipples, such as a reddened or scaly area.  Have unusual discharge from your nipples.  Find a lump or thick area that was not there before.  Have pain in your breasts.  Have any concerns related to your breast health. Summary  Breast self-awareness includes looking for physical changes in your breasts, as well as feeling for any changes within your breasts.  Breast self-awareness should be performed in front of a mirror in a well-lit room.  You should examine your breasts every month. If you menstruate, the best time to examine your breasts is 5-7 days after your menstrual period.  Let your health care provider know of any changes you notice in your breasts, including changes in size, changes on the skin, pain or tenderness, or unusual fluid from your nipples. This information is not intended to replace advice given to you by your health care provider. Make sure you discuss any questions you have with your health care provider. Document Revised: 11/27/2017 Document Reviewed: 11/27/2017 Elsevier Patient Education  [place].

## 2020-08-24 ENCOUNTER — Other Ambulatory Visit: Payer: BC Managed Care – PPO

## 2020-08-24 ENCOUNTER — Other Ambulatory Visit: Payer: Self-pay

## 2020-08-24 DIAGNOSIS — Z01419 Encounter for gynecological examination (general) (routine) without abnormal findings: Secondary | ICD-10-CM

## 2020-08-25 LAB — COMPREHENSIVE METABOLIC PANEL
ALT: 29 IU/L (ref 0–32)
AST: 14 IU/L (ref 0–40)
Albumin/Globulin Ratio: 1.9 (ref 1.2–2.2)
Albumin: 4.5 g/dL (ref 3.9–5.0)
Alkaline Phosphatase: 82 IU/L (ref 44–121)
BUN/Creatinine Ratio: 15 (ref 9–23)
BUN: 11 mg/dL (ref 6–20)
Bilirubin Total: 0.4 mg/dL (ref 0.0–1.2)
CO2: 23 mmol/L (ref 20–29)
Calcium: 9.5 mg/dL (ref 8.7–10.2)
Chloride: 103 mmol/L (ref 96–106)
Creatinine, Ser: 0.74 mg/dL (ref 0.57–1.00)
Globulin, Total: 2.4 g/dL (ref 1.5–4.5)
Glucose: 80 mg/dL (ref 65–99)
Potassium: 4.4 mmol/L (ref 3.5–5.2)
Sodium: 141 mmol/L (ref 134–144)
Total Protein: 6.9 g/dL (ref 6.0–8.5)
eGFR: 114 mL/min/{1.73_m2} (ref >59)

## 2020-08-25 LAB — CBC
Hematocrit: 39.2 % (ref 34.0–46.6)
Hemoglobin: 12.6 g/dL (ref 11.1–15.9)
MCH: 29.2 pg (ref 26.6–33.0)
MCHC: 32.1 g/dL (ref 31.5–35.7)
MCV: 91 fL (ref 79–97)
Platelets: 223 10*3/uL (ref 150–450)
RBC: 4.32 x10E6/uL (ref 3.77–5.28)
RDW: 12.1 % (ref 11.7–15.4)
WBC: 8.2 10*3/uL (ref 3.4–10.8)
